# Patient Record
Sex: Male | Born: 1995 | Race: White | Hispanic: No | Marital: Single | Smoking: Current every day smoker
Health system: Southern US, Community
[De-identification: ages and names within clinical notes are randomized; demographics above are authoritative.]

## PROBLEM LIST (undated history)

## (undated) DIAGNOSIS — F32A Depression, unspecified: Secondary | ICD-10-CM

## (undated) DIAGNOSIS — F419 Anxiety disorder, unspecified: Secondary | ICD-10-CM

## (undated) DIAGNOSIS — F319 Bipolar disorder, unspecified: Secondary | ICD-10-CM

## (undated) DIAGNOSIS — F901 Attention-deficit hyperactivity disorder, predominantly hyperactive type: Secondary | ICD-10-CM

## (undated) DIAGNOSIS — F329 Major depressive disorder, single episode, unspecified: Secondary | ICD-10-CM

## (undated) HISTORY — PX: OTHER SURGICAL HISTORY: SHX169

## (undated) HISTORY — PX: FINGER SURGERY: SHX640

## (undated) HISTORY — PX: HAND SURGERY: SHX662

---
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
## 2015-04-25 ENCOUNTER — Encounter (HOSPITAL_BASED_OUTPATIENT_CLINIC_OR_DEPARTMENT_OTHER): Payer: Self-pay

## 2015-04-25 ENCOUNTER — Emergency Department (HOSPITAL_BASED_OUTPATIENT_CLINIC_OR_DEPARTMENT_OTHER)
Admission: EM | Admit: 2015-04-25 | Discharge: 2015-04-25 | Disposition: A | Discharge status: Home / Self Care | Payer: Medicaid Other | Attending: Emergency Medicine | Admitting: Emergency Medicine

## 2015-04-25 ENCOUNTER — Emergency Department (HOSPITAL_BASED_OUTPATIENT_CLINIC_OR_DEPARTMENT_OTHER): Payer: Medicaid Other

## 2015-04-25 DIAGNOSIS — R1033 Periumbilical pain: Secondary | ICD-10-CM | POA: Insufficient documentation

## 2015-04-25 DIAGNOSIS — R1031 Right lower quadrant pain: Secondary | ICD-10-CM | POA: Insufficient documentation

## 2015-04-25 DIAGNOSIS — Z79899 Other long term (current) drug therapy: Secondary | ICD-10-CM | POA: Insufficient documentation

## 2015-04-25 DIAGNOSIS — F909 Attention-deficit hyperactivity disorder, unspecified type: Secondary | ICD-10-CM | POA: Diagnosis not present

## 2015-04-25 DIAGNOSIS — F329 Major depressive disorder, single episode, unspecified: Secondary | ICD-10-CM | POA: Diagnosis not present

## 2015-04-25 DIAGNOSIS — Z720 Tobacco use: Secondary | ICD-10-CM | POA: Insufficient documentation

## 2015-04-25 DIAGNOSIS — R1030 Lower abdominal pain, unspecified: Secondary | ICD-10-CM | POA: Diagnosis not present

## 2015-04-25 DIAGNOSIS — R112 Nausea with vomiting, unspecified: Secondary | ICD-10-CM | POA: Diagnosis present

## 2015-04-25 LAB — URINALYSIS, ROUTINE W REFLEX MICROSCOPIC
Bilirubin Urine: NEGATIVE
GLUCOSE, UA: NEGATIVE mg/dL
HGB URINE DIPSTICK: NEGATIVE
Ketones, ur: 15 mg/dL — AB
LEUKOCYTES UA: NEGATIVE
Nitrite: NEGATIVE
PH: 6 (ref 5.0–8.0)
PROTEIN: NEGATIVE mg/dL
SPECIFIC GRAVITY, URINE: 1.029 (ref 1.005–1.030)
Urobilinogen, UA: 1 mg/dL (ref 0.0–1.0)

## 2015-04-25 LAB — CBC WITH DIFFERENTIAL/PLATELET
BASOS ABS: 0.1 10*3/uL (ref 0.0–0.1)
Basophils Relative: 1 %
EOS PCT: 2 %
Eosinophils Absolute: 0.1 10*3/uL (ref 0.0–0.7)
HCT: 47.8 % (ref 39.0–52.0)
Hemoglobin: 16.9 g/dL (ref 13.0–17.0)
LYMPHS PCT: 27 %
Lymphs Abs: 1.3 10*3/uL (ref 0.7–4.0)
MCH: 29.9 pg (ref 26.0–34.0)
MCHC: 35.4 g/dL (ref 30.0–36.0)
MCV: 84.5 fL (ref 78.0–100.0)
MONO ABS: 0.5 10*3/uL (ref 0.1–1.0)
Monocytes Relative: 10 %
Neutro Abs: 2.8 10*3/uL (ref 1.7–7.7)
Neutrophils Relative %: 60 %
PLATELETS: 194 10*3/uL (ref 150–400)
RBC: 5.66 MIL/uL (ref 4.22–5.81)
RDW: 12.2 % (ref 11.5–15.5)
WBC: 4.7 10*3/uL (ref 4.0–10.5)

## 2015-04-25 LAB — COMPREHENSIVE METABOLIC PANEL
ALT: 35 U/L (ref 17–63)
ANION GAP: 4 — AB (ref 5–15)
AST: 29 U/L (ref 15–41)
Albumin: 4.7 g/dL (ref 3.5–5.0)
Alkaline Phosphatase: 52 U/L (ref 38–126)
BUN: 16 mg/dL (ref 6–20)
CHLORIDE: 108 mmol/L (ref 101–111)
CO2: 26 mmol/L (ref 22–32)
Calcium: 9.5 mg/dL (ref 8.9–10.3)
Creatinine, Ser: 1.05 mg/dL (ref 0.61–1.24)
Glucose, Bld: 95 mg/dL (ref 65–99)
POTASSIUM: 3.8 mmol/L (ref 3.5–5.1)
Sodium: 138 mmol/L (ref 135–145)
Total Bilirubin: 0.8 mg/dL (ref 0.3–1.2)
Total Protein: 7.4 g/dL (ref 6.5–8.1)

## 2015-04-25 LAB — LIPASE, BLOOD: LIPASE: 41 U/L (ref 22–51)

## 2015-04-25 MED ORDER — IOHEXOL 300 MG/ML  SOLN
100.0000 mL | Freq: Once | INTRAMUSCULAR | Status: AC | PRN
  Administered 2015-04-25: 100 mL via INTRAVENOUS

## 2015-04-25 MED ORDER — SODIUM CHLORIDE 0.9 % IV BOLUS (SEPSIS)
1000.0000 mL | Freq: Once | INTRAVENOUS | Status: AC
  Administered 2015-04-25: 1000 mL via INTRAVENOUS

## 2015-04-25 MED ORDER — ONDANSETRON HCL 4 MG/2ML IJ SOLN
4.0000 mg | Freq: Once | INTRAMUSCULAR | Status: AC
  Administered 2015-04-25: 4 mg via INTRAVENOUS
  Filled 2015-04-25: qty 2

## 2015-04-25 MED ORDER — ONDANSETRON HCL 4 MG PO TABS: 4.0000 mg | ORAL_TABLET | Freq: Four times a day (QID) | ORAL | Status: DC

## 2015-04-25 MED ORDER — IOHEXOL 300 MG/ML  SOLN
25.0000 mL | Freq: Once | INTRAMUSCULAR | Status: AC | PRN
  Administered 2015-04-25: 25 mL via ORAL

## 2015-04-25 NOTE — ED Notes (Signed)
Pt reports was at work when suddenly with abd pain and n/v.  Denies diarrhea.  States his dad last night had same symptoms.

## 2015-04-25 NOTE — Discharge Instructions (Signed)

## 2015-04-25 NOTE — ED Notes (Signed)
MD at bedside. 

## 2015-04-25 NOTE — ED Provider Notes (Signed)
CSN: [id number]     Arrival date & time 04/25/15  1107 History   First MD Initiated Contact with Patient 04/25/15 1148     Chief Complaint  Patient presents with  . Abdominal Pain     (Consider location/radiation/quality/duration/timing/severity/associated sxs/prior Treatment) HPI Comments: Patient with sudden onset of nausea and vomiting while at work around 10 AM. States is vomited about 5 times. Nonbloody and nonbilious. His father last night was also vomiting for unknown reason. Patient denies any diarrhea or fever. He denies any urinary symptoms. Denies any recent travel or antibiotic use. He also has lower abdominal pain in the right side which is been an ongoing issue he says for the past 3 years and is unchanged today.  Patient is a 19 y.o. male presenting with abdominal pain. The history is provided by the patient.  Abdominal Pain Associated symptoms: nausea and vomiting   Associated symptoms: no chest pain, no cough, no diarrhea, no dysuria, no fever and no shortness of breath     Past Medical History  Diagnosis Date  . ADHD, impulsive type     intermittent explosive disorder  . Depression    Past Surgical History  Procedure Laterality Date  . Hand surgery    . Dental    . Finger surgery     No family history on file. Social History  Substance Use Topics  . Smoking status: Current Every Day Smoker -- 1.00 packs/day    Types: Cigarettes  . Smokeless tobacco: None  . Alcohol Use: No    Review of Systems  Constitutional: Positive for activity change and appetite change. Negative for fever.  HENT: Negative for congestion.   Respiratory: Negative for cough, chest tightness and shortness of breath.   Cardiovascular: Negative for chest pain.  Gastrointestinal: Positive for nausea, vomiting and abdominal pain. Negative for diarrhea.  Genitourinary: Negative for dysuria and urgency.  Musculoskeletal: Negative for back pain and arthralgias.  Skin: Negative for rash.   Neurological: Negative for dizziness, weakness and headaches.  A complete 10 system review of systems was obtained and all systems are negative except as noted in the HPI and PMH.      Allergies  Amphetamine-dextroamphetamine  Home Medications   Prior to Admission medications   Medication Sig Start Date End Date Taking? Authorizing Provider  ARIPiprazole (ABILIFY) 10 MG tablet Take 10 mg by mouth at bedtime.    Historical Provider, MD  benztropine (COGENTIN) 1 MG tablet Take 1 mg by mouth 2 (two) times daily.    Historical Provider, MD  guanFACINE (TENEX) 1 MG tablet Take 1 mg by mouth 2 (two) times daily.    Historical Provider, MD  lisdexamfetamine (VYVANSE) 40 MG capsule Take 40 mg by mouth every morning.    Historical Provider, MD  ondansetron (ZOFRAN) 4 MG tablet Take 1 tablet (4 mg total) by mouth every 6 (six) hours. 04/25/15   Glynn OctaveStephen Conlin Brahm, MD   BP 125/74 mmHg  Pulse 65  Temp(Src) 98 F (36.7 C) (Oral)  Resp 16  Ht 6\' 3"  (1.905 m)  Wt 245 lb (111.131 kg)  BMI 30.62 kg/m2  SpO2 98% Physical Exam  Constitutional: He is oriented to person, place, and time. He appears well-developed and well-nourished. No distress.  HENT:  Head: Normocephalic and atraumatic.  Mouth/Throat: Oropharynx is clear and moist. No oropharyngeal exudate.  Eyes: Conjunctivae and EOM are normal. Pupils are equal, round, and reactive to light.  Neck: Normal range of motion. Neck supple.  No meningismus.  Cardiovascular: Normal rate, regular rhythm, normal heart sounds and intact distal pulses.   No murmur heard. Pulmonary/Chest: Effort normal and breath sounds normal. No respiratory distress.  Abdominal: Soft. There is tenderness. There is no rebound and no guarding.  TTP periumbilical and RLQ. No guarding  Musculoskeletal: Normal range of motion. He exhibits no edema or tenderness.  Neurological: He is alert and oriented to person, place, and time. No cranial nerve deficit. He exhibits normal  muscle tone. Coordination normal.  No ataxia on finger to nose bilaterally. No pronator drift. 5/5 strength throughout. CN 2-12 intact. Negative Romberg. Equal grip strength. Sensation intact. Gait is normal.   Skin: Skin is warm.  Psychiatric: He has a normal mood and affect. His behavior is normal.  Nursing note and vitals reviewed.   ED Course  Procedures (including critical care time) Labs Review Labs Reviewed  URINALYSIS, ROUTINE W REFLEX MICROSCOPIC (NOT AT [facility]) - Abnormal; Notable for the following:    Color, Urine AMBER (*)    Ketones, ur 15 (*)    All other components within normal limits  COMPREHENSIVE METABOLIC PANEL - Abnormal; Notable for the following:    Anion gap 4 (*)    All other components within normal limits  CBC WITH DIFFERENTIAL/PLATELET  LIPASE, BLOOD    Imaging Review Ct Abdomen Pelvis W Contrast  04/25/2015  CLINICAL DATA:  Right lower quadrant pain, nausea and vomiting starting today EXAM: CT ABDOMEN AND PELVIS WITH CONTRAST TECHNIQUE: Multidetector CT imaging of the abdomen and pelvis was performed using the standard protocol following bolus administration of intravenous contrast. CONTRAST:  25mL OMNIPAQUE IOHEXOL 300 MG/ML SOLN, OMNIPAQUE IOHEXOL 300 MG/ML SOLN COMPARISON:  None. FINDINGS: Lung bases are unremarkable. Liver, pancreas, spleen and adrenal glands are unremarkable. No calcified gallstones are noted within gallbladder. No aortic aneurysm. Kidneys are symmetrical in size and enhancement. No hydronephrosis or hydroureter. No small bowel obstruction. No ascites or free air. No adenopathy. No pericecal inflammation. Normal appendix noted in axial image 75. Some colonic stool and gas noted in transverse colon and descending colon. Moderate stool noted in sigmoid colon. Some colonic gas noted mid sigmoid colon. No distal colonic obstruction. Prostate gland and seminal vesicles are unremarkable. The urinary bladder is unremarkable. No destructive bony  lesions are noted within pelvis. There is no inguinal adenopathy. IMPRESSION: 1. No acute inflammatory process within abdomen or pelvis. 2. No hydronephrosis or hydroureter. 3. Normal appendix.  No pericecal inflammation. 4. No small bowel obstruction.  What Electronically Signed   By: Natasha Mead M.D.   On: 04/25/2015 13:14   I have personally reviewed and evaluated these images and lab results as part of my medical decision-making.   EKG Interpretation None      MDM   Final diagnoses:  Nausea and vomiting, vomiting of unspecified type   Acute onset of nausea and vomiting 2 hours ago with similar contacts at home. Ongoing right lower quadrant abdominal pain for the past 3 years.  UA negative. Labs normal.   CT scan shows normal appendix. No other acute pathology. Patient tolerated by mouth. No further vomiting.  May be related to viral illness that he has had at home with sick contacts. Zofran given for symptomatic relief. Follow-up with PCP. Return precautions discussed.     Glynn Octave, MD 04/25/15 [phone]

## 2015-04-25 NOTE — ED Notes (Signed)
Pt given apple juice  

## 2017-02-06 IMAGING — CT CT ABD-PELV W/ CM
2 of 4 series · 16 of 46 positions shown, 18 images · IV contrast (APPLIED)
Comparison: None.

CLINICAL DATA: Right lower quadrant pain, nausea and vomiting
starting today

EXAM:
CT ABDOMEN AND PELVIS WITH CONTRAST
TECHNIQUE: Multidetector CT imaging of the abdomen and pelvis was performed
using the standard protocol following bolus administration of
intravenous contrast.
CONTRAST:  25mL OMNIPAQUE IOHEXOL 300 MG/ML SOLN, 100mL OMNIPAQUE
IOHEXOL 300 MG/ML SOLN

[Series 2: abd/pelvis 5.0 b31f · axial · 0.81mm/px · z∈[+831,+1326]mm · 13 of 109 slices shown, 15 images]
[im 5/109  soft-tissue]
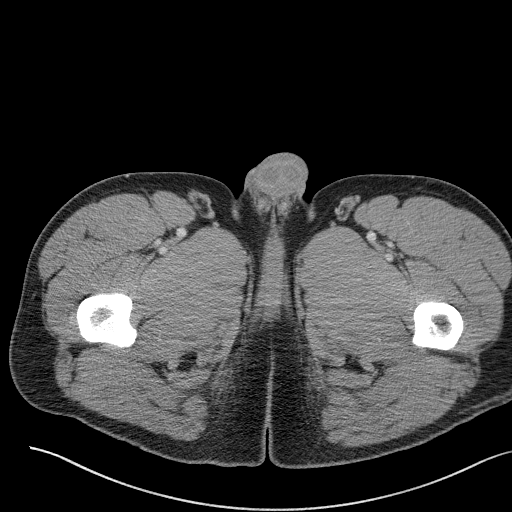
[im 5/109  bone]
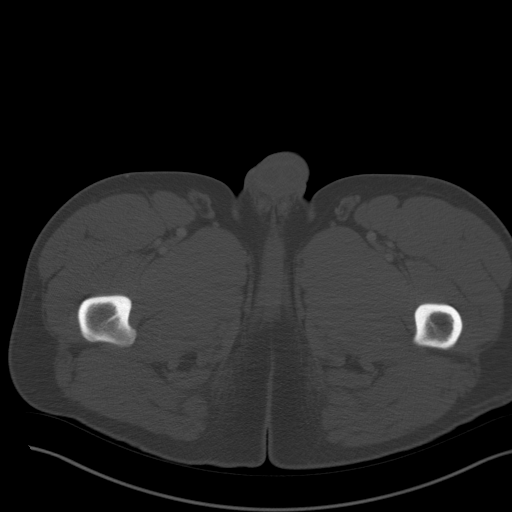
[im 13/109  soft-tissue]
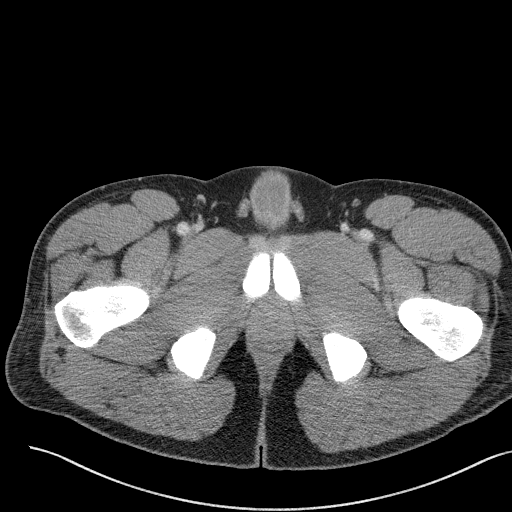
[im 22/109  soft-tissue]
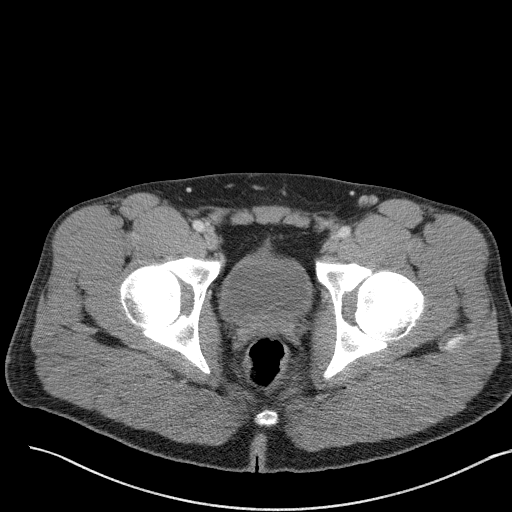
[im 31/109  soft-tissue]
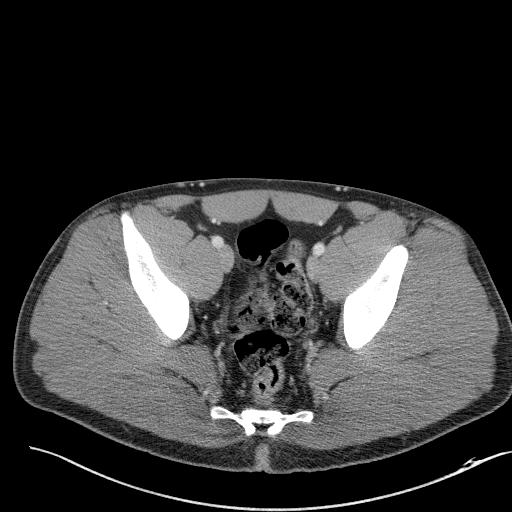
[im 39/109  soft-tissue]
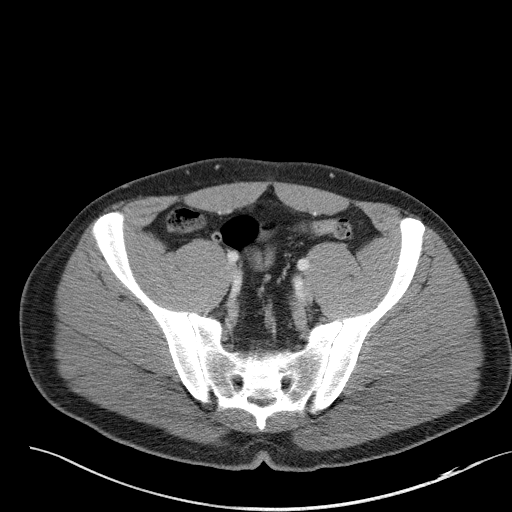
[im 48/109  soft-tissue]
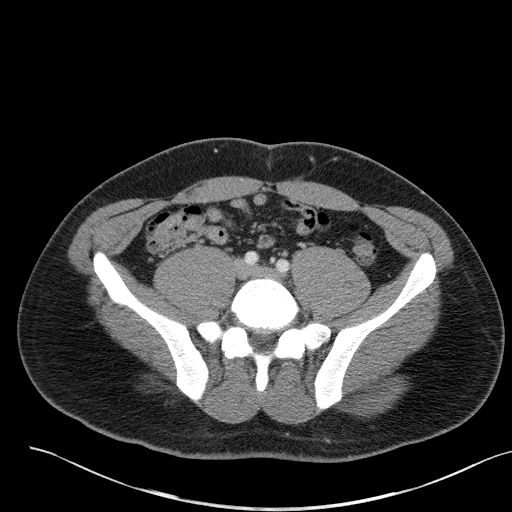
[im 57/109  soft-tissue]
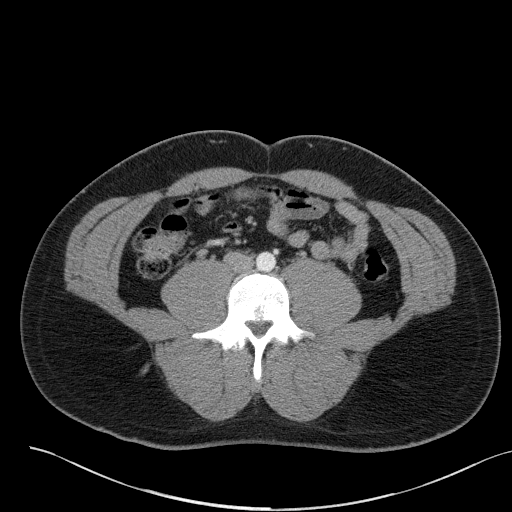
[im 61/109  soft-tissue]
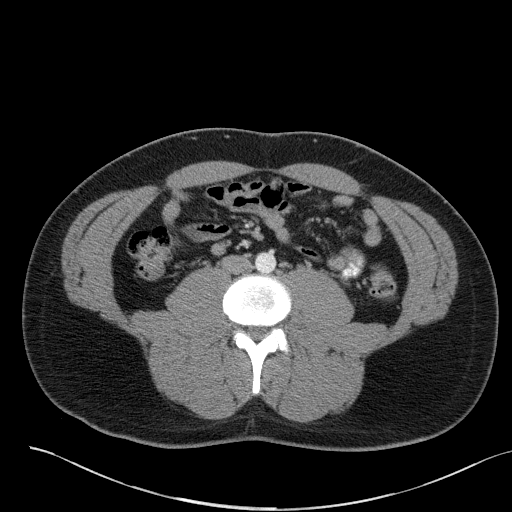
[im 70/109  soft-tissue]
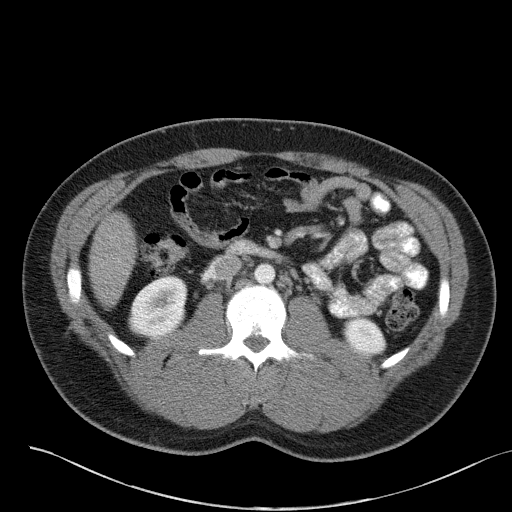
[im 70/109  bone]
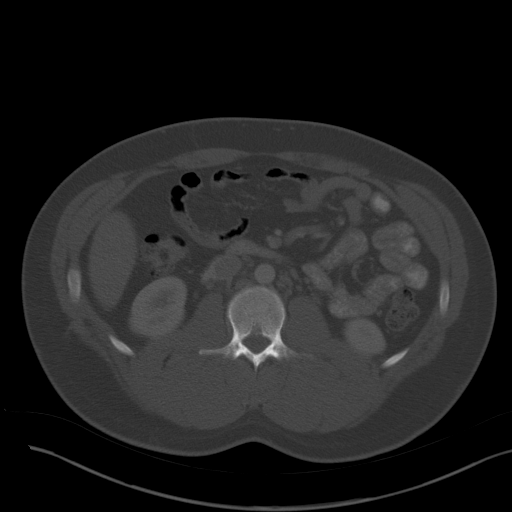
[im 78/109  soft-tissue]
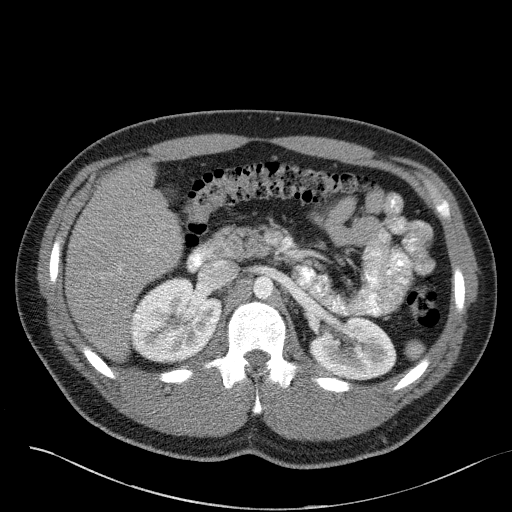
[im 87/109  soft-tissue]
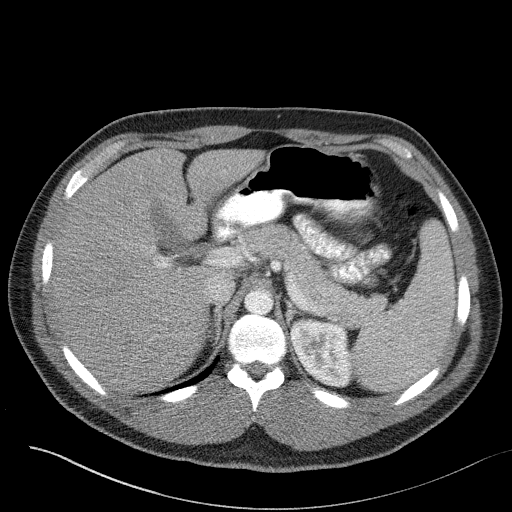
[im 96/109  soft-tissue]
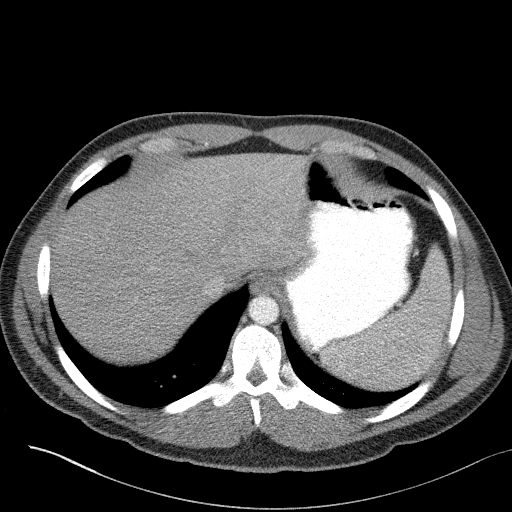
[im 104/109  soft-tissue]
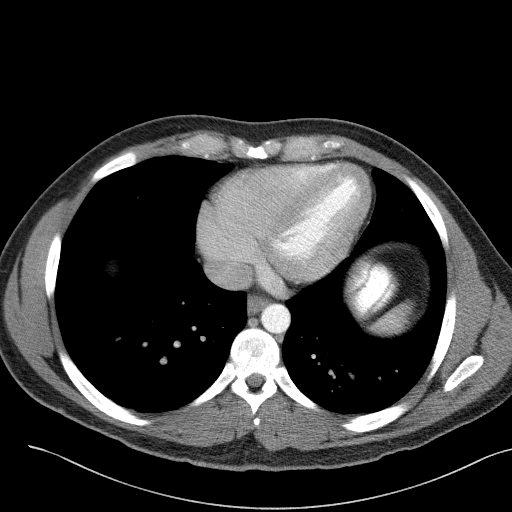

[Series 5: abd/pelvis 3.0 coronal · coronal · 0.78mm/px · 3 of 98 slices shown]
[im 33/98  soft-tissue]
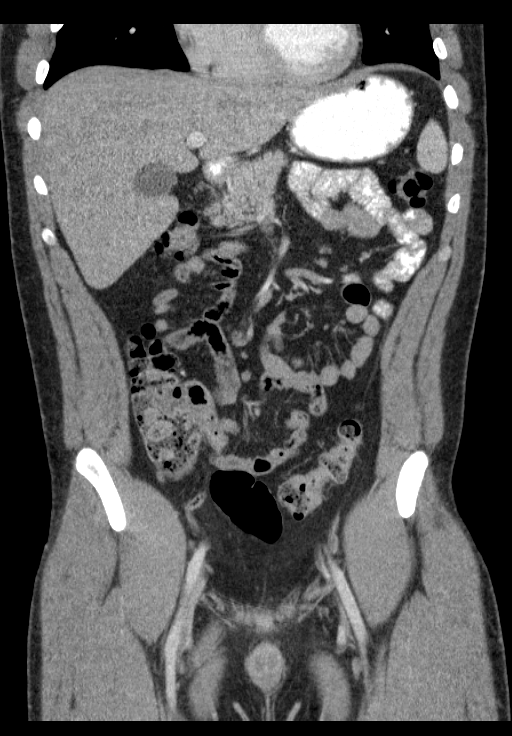
[im 44/98  soft-tissue]
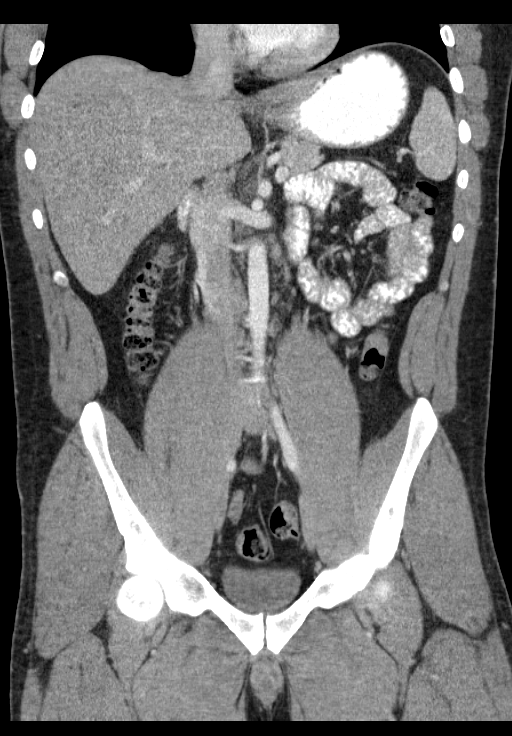
[im 54/98  soft-tissue]
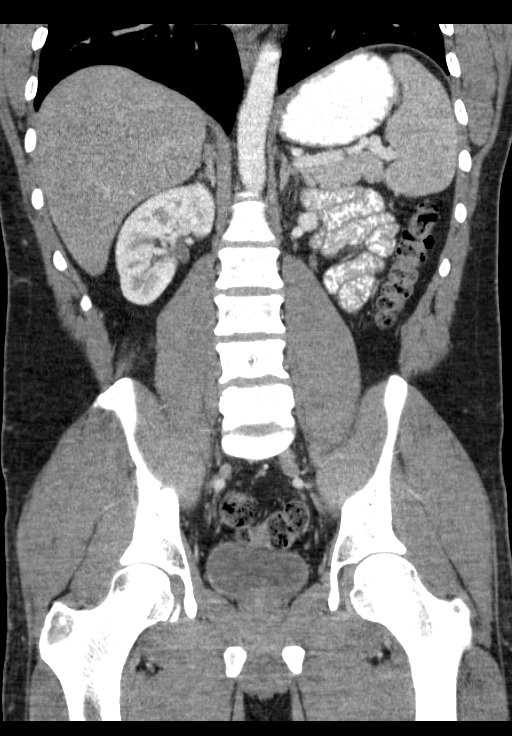

[16 of 46 positions shown; findings below may reference images not displayed]

FINDINGS: Lung bases are unremarkable. Liver, pancreas, spleen and adrenal
glands are unremarkable. No calcified gallstones are noted within
gallbladder. No aortic aneurysm. Kidneys are symmetrical in size and
enhancement. No hydronephrosis or hydroureter.

No small bowel obstruction. No ascites or free air. No adenopathy.
No pericecal inflammation. Normal appendix noted in axial image 75.

Some colonic stool and gas noted in transverse colon and descending
colon. Moderate stool noted in sigmoid colon. Some colonic gas noted
mid sigmoid colon. No distal colonic obstruction. Prostate gland and
seminal vesicles are unremarkable. The urinary bladder is
unremarkable. No destructive bony lesions are noted within pelvis.
There is no inguinal adenopathy.
IMPRESSION: 1. No acute inflammatory process within abdomen or pelvis.
2. No hydronephrosis or hydroureter.
3. Normal appendix.  No pericecal inflammation.
4. No small bowel obstruction.  What

## 2018-01-03 ENCOUNTER — Emergency Department
Admission: EM | Admit: 2018-01-03 | Discharge: 2018-01-03 | Disposition: A | Discharge status: Home / Self Care | Payer: Self-pay | Attending: Emergency Medicine | Admitting: Emergency Medicine

## 2018-01-03 ENCOUNTER — Other Ambulatory Visit: Payer: Self-pay

## 2018-01-03 ENCOUNTER — Emergency Department: Payer: Self-pay

## 2018-01-03 DIAGNOSIS — F17210 Nicotine dependence, cigarettes, uncomplicated: Secondary | ICD-10-CM | POA: Insufficient documentation

## 2018-01-03 DIAGNOSIS — R079 Chest pain, unspecified: Secondary | ICD-10-CM | POA: Insufficient documentation

## 2018-01-03 LAB — BASIC METABOLIC PANEL
Anion gap: 7 (ref 5–15)
BUN: 16 mg/dL (ref 6–20)
CHLORIDE: 111 mmol/L (ref 98–111)
CO2: 23 mmol/L (ref 22–32)
CREATININE: 0.9 mg/dL (ref 0.61–1.24)
Calcium: 9.2 mg/dL (ref 8.9–10.3)
GFR calc non Af Amer: >60 mL/min (ref >60)
GLUCOSE: 109 mg/dL — AB (ref 70–99)
Potassium: 3.9 mmol/L (ref 3.5–5.1)
Sodium: 141 mmol/L (ref 135–145)

## 2018-01-03 LAB — CBC
HCT: 47.3 % (ref 40.0–52.0)
Hemoglobin: 16.7 g/dL (ref 13.0–18.0)
MCH: 30.4 pg (ref 26.0–34.0)
MCHC: 35.4 g/dL (ref 32.0–36.0)
MCV: 85.9 fL (ref 80.0–100.0)
PLATELETS: 221 10*3/uL (ref 150–440)
RBC: 5.51 MIL/uL (ref 4.40–5.90)
RDW: 12.4 % (ref 11.5–14.5)
WBC: 6.9 10*3/uL (ref 3.8–10.6)

## 2018-01-03 LAB — TROPONIN I
Troponin I: <0.03 ng/mL (ref <0.03)
Troponin I: <0.03 ng/mL (ref <0.03)

## 2018-01-03 MED ORDER — ASPIRIN 81 MG PO CHEW
324.0000 mg | CHEWABLE_TABLET | Freq: Once | ORAL | Status: AC
  Administered 2018-01-03: 324 mg via ORAL
  Filled 2018-01-03: qty 4

## 2018-01-03 NOTE — ED Notes (Signed)
NAD noted at time of D/C. Pt denies questions or concerns. Pt ambulatory to the lobby at this time.  

## 2018-01-03 NOTE — ED Triage Notes (Signed)
Patient to ED via EMS for chest pain.

## 2018-01-03 NOTE — ED Notes (Signed)
Pt presents to ED with c/o substernal chest pain that started "at some point last night" while at a church. Pt reports "ex-fiance problems at home". Pt is alert and oriented. Pt reports being awake for 36 hours. Pt states pain has resolved at this time, however while the chest pain was occurring pt reports he was dizzy and nauseas. Pt visualized in NAD at this time.

## 2018-01-03 NOTE — Discharge Instructions (Addendum)

## 2018-01-03 NOTE — ED Provider Notes (Signed)
[facility] Emergency Department Provider Note   ____________________________________________   First MD Initiated Contact with Patient 01/03/18 [phone]     (approximate)  I have reviewed the triage vital signs and the nursing notes.   HISTORY  Chief Complaint Chest Pain    HPI Ella Guillotte is a 22 y.o. male reports he had chest pain earlier today  Patient reports that he has not been sleeping too well, he was recently in a break-up with his fiance.  He reports that for the last day she felt anxious and stressed out about it.  Last night he began experiencing a feeling of a sharp discomfort underneath his breastbone, came and went and lasted maybe an hour.  Reports he was having pain on the paramedics arrived to his house, but now after he is rested he is not having any further pain or trouble.  Denies any ongoing symptoms  No personal history of heart disease.  No history of blood clots.  No recent or major surgeries.  No leg swelling.  He does smoke occasionally.  Does not take any medications at all.  As of breath.  Nothing seemed to make the pain better or worse.  Is not a heaviness or pressure feeling and it did not move or radiate.  Is not associated any nausea vomiting or sweats.  Again, reports all pain and symptoms are resolved now.  Reports he was trying to rest at a nearby church when this started.   Past Medical History:  Diagnosis Date  . ADHD, impulsive type    intermittent explosive disorder  . Depression     There are no active problems to display for this patient.   Past Surgical History:  Procedure Laterality Date  . dental    . FINGER SURGERY    . HAND SURGERY     Patient reports family history of heart disease and diabetes  Current medications Reports none  Allergies Amphetamine-dextroamphetamine  No family history on file.  Social History Social History   Tobacco Use  . Smoking status: Current Every Day Smoker    Packs/day: 1.00    Types: Cigarettes  Substance Use Topics  . Alcohol use: No  . Drug use: No    Review of Systems Constitutional: No fever/chills Eyes: No visual changes. ENT: No sore throat. Cardiovascular: See HPI  Respiratory: Denies shortness of breath. Gastrointestinal: No abdominal pain.  No nausea, no vomiting.   Genitourinary: Negative for dysuria. Musculoskeletal: Negative for back pain. Skin: Negative for rash. Neurological: Negative for headaches, focal weakness or numbness.    ____________________________________________   PHYSICAL EXAM:  VITAL SIGNS: ED Triage Vitals  Enc Vitals Group     BP 01/03/18 0515 118/83     Pulse Rate 01/03/18 0515 76     Resp 01/03/18 0515 20     Temp 01/03/18 0515 98.7 F (37.1 C)     Temp Source 01/03/18 0515 Oral     SpO2 01/03/18 0515 97 %     Weight 01/03/18 0516 240 lb (108.9 kg)     Height 01/03/18 0516 6\' 3"  (1.905 m)     Head Circumference --      Peak Flow --      Pain Score --      Pain Loc --      Pain Edu? --      Excl. in GC? --     Constitutional: Alert and oriented. Well appearing and in no acute distress.  Resting  on entry, awakens and sits up is very pleasant. Eyes: Conjunctivae are normal. Head: Atraumatic. Nose: No congestion/rhinnorhea. Mouth/Throat: Mucous membranes are moist. Neck: No stridor.   Cardiovascular: Normal rate, regular rhythm. Grossly normal heart sounds.  Good peripheral circulation.  No chest pain induced by laying flat or sitting up. Respiratory: Normal respiratory effort.  No retractions. Lungs CTAB. Gastrointestinal: Soft and nontender. No distention. Musculoskeletal: No lower extremity tenderness nor edema. Neurologic:  Normal speech and language. No gross focal neurologic deficits are appreciated.  No lower extremity edema.  No venous cords or congestion. Skin:  Skin is warm, dry and intact. No rash noted. Psychiatric: Mood and affect are normal. Speech and behavior are  normal.  ____________________________________________   LABS (all labs ordered are listed, but only abnormal results are displayed)  Labs Reviewed  BASIC METABOLIC PANEL - Abnormal; Notable for the following components:      Result Value   Glucose, Bld 109 (*)    All other components within normal limits  CBC  TROPONIN I  TROPONIN I   ____________________________________________  EKG  Reviewed and entered by me at 5:20 AM Heart rate 75 QRS 110 QTc 440 Normal sinus rhythm, no evidence of ischemia or ectopy ____________________________________________  RADIOLOGY    Chest x-ray reviewed normal ____________________________________________   PROCEDURES  Procedure(s) performed: None  Procedures  Critical Care performed: No  ____________________________________________   INITIAL IMPRESSION / ASSESSMENT AND PLAN / ED COURSE  Pertinent labs & imaging results that were available during my care of the patient were reviewed by me and considered in my medical decision making (see chart for details).  Differential diagnosis includes, but is not limited to, ACS, aortic dissection, pulmonary embolism, cardiac tamponade, pneumothorax, pneumonia, pericarditis, myocarditis, GI-related causes including esophagitis/gastritis, and musculoskeletal chest wall pain.         Pulmonary Embolism Rule-out Criteria (PERC rule)                        If YES to ANY of the following, the PERC rule is not satisfied and cannot be used to rule out PE in this patient (consider d-dimer or imaging depending on pre-test probability).                      If NO to ALL of the following, AND the clinician's pre-test probability is <15%, the [facility] rule is satisfied and there is no need for further workup (including no need to obtain a d-dimer) as the post-test probability of pulmonary embolism is <2%.                      Mnemonic is HAD CLOTS   H - hormone use (exogenous estrogen)      No. A -  age > 50                                                 No. D - DVT/PE history                                      No.   C - coughing blood (hemoptysis)  No. L - leg swelling, unilateral                             No. O - O2 Sat on Room Air < 95%                  No. T - tachycardia (HR ? 100)                         No. S - surgery or trauma, recent                      No.   Based on my evaluation of the patient, including application of this decision instrument, further testing to evaluate for pulmonary embolism is not indicated at this time.   ----------------------------------------- 7:36 AM on 01/03/2018 -----------------------------------------  There is low risk for coronary disease.  Atypical chest pain resolved on its own without treatment.  Discussed with patient, will plan to have the [facility] as he reports he has no primary physician.  In addition, we discussed careful return precautions.  Check a second troponin if negative anticipate discharge home.  Trop 2 normal  Return precautions and treatment recommendations and follow-up discussed with the patient who is agreeable with the plan.        ____________________________________________   FINAL CLINICAL IMPRESSION(S) / ED DIAGNOSES  Final diagnoses:  Chest pain with low risk for cardiac etiology      NEW MEDICATIONS STARTED DURING THIS VISIT:  New Prescriptions   No medications on file     Note:  This document was prepared using Dragon voice recognition software and may include unintentional dictation errors.     Sharyn Creamer, MD 01/03/18 [phone]

## 2018-01-03 NOTE — ED Notes (Signed)
Patient to ED via EMS for chest pain.  EMS reports patient recently kicked out of girlfriends home and recently lost job.  Reports no sleep in approximately 36 hours.

## 2019-07-12 ENCOUNTER — Emergency Department (HOSPITAL_COMMUNITY): Payer: Medicaid Other | Admitting: Anesthesiology

## 2019-07-12 ENCOUNTER — Inpatient Hospital Stay (HOSPITAL_COMMUNITY)
Admission: EM | Admit: 2019-07-12 | Discharge: 2019-07-13 | DRG: 420 | Disposition: A | Discharge status: Home / Self Care | Payer: Medicaid Other | Attending: Surgery | Admitting: Surgery

## 2019-07-12 ENCOUNTER — Encounter (HOSPITAL_COMMUNITY): Payer: Self-pay

## 2019-07-12 ENCOUNTER — Encounter (HOSPITAL_COMMUNITY): Admission: EM | Disposition: A | Discharge status: Home / Self Care | Payer: Self-pay | Source: Home / Self Care

## 2019-07-12 ENCOUNTER — Other Ambulatory Visit: Payer: Self-pay

## 2019-07-12 DIAGNOSIS — S31610A Laceration without foreign body of abdominal wall, right upper quadrant with penetration into peritoneal cavity, initial encounter: Secondary | ICD-10-CM | POA: Diagnosis present

## 2019-07-12 DIAGNOSIS — Z20822 Contact with and (suspected) exposure to covid-19: Secondary | ICD-10-CM | POA: Diagnosis present

## 2019-07-12 DIAGNOSIS — R030 Elevated blood-pressure reading, without diagnosis of hypertension: Secondary | ICD-10-CM | POA: Diagnosis present

## 2019-07-12 DIAGNOSIS — S31119A Laceration without foreign body of abdominal wall, unspecified quadrant without penetration into peritoneal cavity, initial encounter: Secondary | ICD-10-CM | POA: Diagnosis present

## 2019-07-12 DIAGNOSIS — F17200 Nicotine dependence, unspecified, uncomplicated: Secondary | ICD-10-CM | POA: Diagnosis present

## 2019-07-12 DIAGNOSIS — S36114A Minor laceration of liver, initial encounter: Principal | ICD-10-CM | POA: Diagnosis present

## 2019-07-12 HISTORY — PX: LAPAROTOMY: SHX154

## 2019-07-12 HISTORY — DX: Bipolar disorder, unspecified: F31.9

## 2019-07-12 HISTORY — PX: LAPAROSCOPY ABDOMEN DIAGNOSTIC: PRO50

## 2019-07-12 HISTORY — PX: LAPAROSCOPY: SHX197

## 2019-07-12 HISTORY — DX: Anxiety disorder, unspecified: F41.9

## 2019-07-12 LAB — CBC
HCT: 49.9 % (ref 39.0–52.0)
Hemoglobin: 17.4 g/dL — ABNORMAL HIGH (ref 13.0–17.0)
MCH: 30 pg (ref 26.0–34.0)
MCHC: 34.9 g/dL (ref 30.0–36.0)
MCV: 86 fL (ref 80.0–100.0)
Platelets: 227 10*3/uL (ref 150–400)
RBC: 5.8 MIL/uL (ref 4.22–5.81)
RDW: 12.2 % (ref 11.5–15.5)
WBC: 7.1 10*3/uL (ref 4.0–10.5)
nRBC: 0 % (ref 0.0–0.2)

## 2019-07-12 LAB — SAMPLE TO BLOOD BANK

## 2019-07-12 LAB — I-STAT CHEM 8, ED
BUN: 16 mg/dL (ref 6–20)
Calcium, Ion: 1.23 mmol/L (ref 1.15–1.40)
Chloride: 105 mmol/L (ref 98–111)
Creatinine, Ser: 1.1 mg/dL (ref 0.61–1.24)
Glucose, Bld: 108 mg/dL — ABNORMAL HIGH (ref 70–99)
HCT: 49 % (ref 39.0–52.0)
Hemoglobin: 16.7 g/dL (ref 13.0–17.0)
Potassium: 3.6 mmol/L (ref 3.5–5.1)
Sodium: 141 mmol/L (ref 135–145)
TCO2: 24 mmol/L (ref 22–32)

## 2019-07-12 LAB — COMPREHENSIVE METABOLIC PANEL
ALT: 58 U/L — ABNORMAL HIGH (ref 0–44)
AST: 40 U/L (ref 15–41)
Albumin: 4.4 g/dL (ref 3.5–5.0)
Alkaline Phosphatase: 44 U/L (ref 38–126)
Anion gap: 13 (ref 5–15)
BUN: 14 mg/dL (ref 6–20)
CO2: 22 mmol/L (ref 22–32)
Calcium: 10 mg/dL (ref 8.9–10.3)
Chloride: 105 mmol/L (ref 98–111)
Creatinine, Ser: 1.17 mg/dL (ref 0.61–1.24)
GFR calc Af Amer: >60 mL/min (ref >60)
GFR calc non Af Amer: >60 mL/min (ref >60)
Glucose, Bld: 112 mg/dL — ABNORMAL HIGH (ref 70–99)
Potassium: 3.7 mmol/L (ref 3.5–5.1)
Sodium: 140 mmol/L (ref 135–145)
Total Bilirubin: <0.1 mg/dL — ABNORMAL LOW (ref 0.3–1.2)
Total Protein: 7.2 g/dL (ref 6.5–8.1)

## 2019-07-12 LAB — ETHANOL: Alcohol, Ethyl (B): <10 mg/dL (ref <10)

## 2019-07-12 LAB — RESPIRATORY PANEL BY RT PCR (FLU A&B, COVID)
Influenza A by PCR: NEGATIVE
Influenza B by PCR: NEGATIVE
SARS Coronavirus 2 by RT PCR: NEGATIVE

## 2019-07-12 LAB — CDS SEROLOGY

## 2019-07-12 LAB — PROTIME-INR
INR: 0.9 (ref 0.8–1.2)
Prothrombin Time: 12.4 seconds (ref 11.4–15.2)

## 2019-07-12 SURGERY — LAPAROSCOPY, DIAGNOSTIC
Anesthesia: General | Site: Abdomen

## 2019-07-12 MED ORDER — CEFAZOLIN SODIUM-DEXTROSE 2-3 GM-%(50ML) IV SOLR
INTRAVENOUS | Status: DC | PRN
  Administered 2019-07-12: 2 g via INTRAVENOUS

## 2019-07-12 MED ORDER — FENTANYL CITRATE (PF) 250 MCG/5ML IJ SOLN
INTRAMUSCULAR | Status: AC
  Filled 2019-07-12: qty 5

## 2019-07-12 MED ORDER — LACTATED RINGERS IV SOLN: INTRAVENOUS | Status: DC | PRN

## 2019-07-12 MED ORDER — ONDANSETRON HCL 4 MG/2ML IJ SOLN
INTRAMUSCULAR | Status: DC | PRN
  Administered 2019-07-12: 4 mg via INTRAVENOUS

## 2019-07-12 MED ORDER — ENOXAPARIN SODIUM 40 MG/0.4ML ~~LOC~~ SOLN
40.0000 mg | SUBCUTANEOUS | Status: DC
  Administered 2019-07-12: 40 mg via SUBCUTANEOUS
  Filled 2019-07-12: qty 0.4

## 2019-07-12 MED ORDER — CEFAZOLIN SODIUM-DEXTROSE 1-4 GM/50ML-% IV SOLN
1.0000 g | Freq: Once | INTRAVENOUS | Status: AC
  Administered 2019-07-12: 1 g via INTRAVENOUS
  Filled 2019-07-12: qty 50

## 2019-07-12 MED ORDER — SUGAMMADEX SODIUM 200 MG/2ML IV SOLN
INTRAVENOUS | Status: DC | PRN
  Administered 2019-07-12: 300 mg via INTRAVENOUS

## 2019-07-12 MED ORDER — ONDANSETRON HCL 4 MG/2ML IJ SOLN: 4.0000 mg | Freq: Once | INTRAMUSCULAR | Status: DC | PRN

## 2019-07-12 MED ORDER — OXYCODONE HCL 5 MG/5ML PO SOLN
5.0000 mg | ORAL | Status: DC | PRN
  Administered 2019-07-12 – 2019-07-13 (×5): 10 mg via ORAL
  Filled 2019-07-12 (×5): qty 10

## 2019-07-12 MED ORDER — 0.9 % SODIUM CHLORIDE (POUR BTL) OPTIME
TOPICAL | Status: DC | PRN
  Administered 2019-07-12 (×3): 1000 mL

## 2019-07-12 MED ORDER — SUCCINYLCHOLINE CHLORIDE 20 MG/ML IJ SOLN
INTRAMUSCULAR | Status: DC | PRN
  Administered 2019-07-12: 140 mg via INTRAVENOUS

## 2019-07-12 MED ORDER — SUCCINYLCHOLINE CHLORIDE 200 MG/10ML IV SOSY
PREFILLED_SYRINGE | INTRAVENOUS | Status: AC
  Filled 2019-07-12: qty 10

## 2019-07-12 MED ORDER — ENOXAPARIN SODIUM 80 MG/0.8ML ~~LOC~~ SOLN
80.0000 mg | SUBCUTANEOUS | Status: DC
  Administered 2019-07-13: 80 mg via SUBCUTANEOUS
  Filled 2019-07-12: qty 0.8

## 2019-07-12 MED ORDER — ONDANSETRON 4 MG PO TBDP: 4.0000 mg | ORAL_TABLET | Freq: Four times a day (QID) | ORAL | Status: DC | PRN

## 2019-07-12 MED ORDER — ARTIFICIAL TEARS OPHTHALMIC OINT
TOPICAL_OINTMENT | OPHTHALMIC | Status: AC
  Filled 2019-07-12: qty 3.5

## 2019-07-12 MED ORDER — LIDOCAINE HCL (CARDIAC) PF 100 MG/5ML IV SOSY
PREFILLED_SYRINGE | INTRAVENOUS | Status: DC | PRN
  Administered 2019-07-12: 60 mg via INTRATRACHEAL

## 2019-07-12 MED ORDER — FENTANYL CITRATE (PF) 100 MCG/2ML IJ SOLN
25.0000 ug | INTRAMUSCULAR | Status: DC | PRN
  Administered 2019-07-12 (×3): 50 ug via INTRAVENOUS

## 2019-07-12 MED ORDER — IBUPROFEN 600 MG PO TABS
600.0000 mg | ORAL_TABLET | Freq: Three times a day (TID) | ORAL | Status: DC
  Administered 2019-07-12 – 2019-07-13 (×5): 600 mg via ORAL
  Filled 2019-07-12 (×5): qty 1

## 2019-07-12 MED ORDER — PROPOFOL 10 MG/ML IV BOLUS
INTRAVENOUS | Status: AC
  Filled 2019-07-12: qty 40

## 2019-07-12 MED ORDER — LACTATED RINGERS IV SOLN: INTRAVENOUS | Status: DC

## 2019-07-12 MED ORDER — ONDANSETRON HCL 4 MG/2ML IJ SOLN
INTRAMUSCULAR | Status: AC
  Filled 2019-07-12: qty 2

## 2019-07-12 MED ORDER — OXYCODONE HCL 5 MG PO TABS: 5.0000 mg | ORAL_TABLET | Freq: Once | ORAL | Status: DC | PRN

## 2019-07-12 MED ORDER — STERILE WATER FOR IRRIGATION IR SOLN
Status: DC | PRN
  Administered 2019-07-12: 1000 mL

## 2019-07-12 MED ORDER — MORPHINE SULFATE (PF) 4 MG/ML IV SOLN
4.0000 mg | INTRAVENOUS | Status: DC | PRN
  Administered 2019-07-12 (×4): 4 mg via INTRAVENOUS
  Filled 2019-07-12 (×3): qty 1

## 2019-07-12 MED ORDER — FENTANYL CITRATE (PF) 100 MCG/2ML IJ SOLN
INTRAMUSCULAR | Status: AC
  Filled 2019-07-12: qty 4

## 2019-07-12 MED ORDER — MIDAZOLAM HCL 2 MG/2ML IJ SOLN
INTRAMUSCULAR | Status: AC
  Filled 2019-07-12: qty 2

## 2019-07-12 MED ORDER — METHOCARBAMOL 1000 MG/10ML IJ SOLN
1000.0000 mg | Freq: Three times a day (TID) | INTRAVENOUS | Status: DC
  Administered 2019-07-12: 1000 mg via INTRAVENOUS
  Filled 2019-07-12 (×5): qty 10

## 2019-07-12 MED ORDER — ONDANSETRON HCL 4 MG/2ML IJ SOLN: 4.0000 mg | Freq: Four times a day (QID) | INTRAMUSCULAR | Status: DC | PRN

## 2019-07-12 MED ORDER — DEXAMETHASONE SODIUM PHOSPHATE 10 MG/ML IJ SOLN
INTRAMUSCULAR | Status: AC
  Filled 2019-07-12: qty 1

## 2019-07-12 MED ORDER — METOPROLOL TARTRATE 5 MG/5ML IV SOLN: 5.0000 mg | Freq: Four times a day (QID) | INTRAVENOUS | Status: DC | PRN

## 2019-07-12 MED ORDER — METHOCARBAMOL 500 MG PO TABS
1000.0000 mg | ORAL_TABLET | Freq: Three times a day (TID) | ORAL | Status: DC
  Administered 2019-07-12 – 2019-07-13 (×5): 1000 mg via ORAL
  Filled 2019-07-12 (×5): qty 2

## 2019-07-12 MED ORDER — ROCURONIUM BROMIDE 100 MG/10ML IV SOLN
INTRAVENOUS | Status: DC | PRN
  Administered 2019-07-12: 30 mg via INTRAVENOUS
  Administered 2019-07-12: 50 mg via INTRAVENOUS

## 2019-07-12 MED ORDER — CEFAZOLIN SODIUM 1 G IJ SOLR
INTRAMUSCULAR | Status: AC
  Filled 2019-07-12: qty 20

## 2019-07-12 MED ORDER — FENTANYL CITRATE (PF) 250 MCG/5ML IJ SOLN
INTRAMUSCULAR | Status: DC | PRN
  Administered 2019-07-12: 100 ug via INTRAVENOUS
  Administered 2019-07-12: 50 ug via INTRAVENOUS
  Administered 2019-07-12 (×2): 100 ug via INTRAVENOUS
  Administered 2019-07-12: 50 ug via INTRAVENOUS
  Administered 2019-07-12: 100 ug via INTRAVENOUS

## 2019-07-12 MED ORDER — ROCURONIUM BROMIDE 10 MG/ML (PF) SYRINGE
PREFILLED_SYRINGE | INTRAVENOUS | Status: AC
  Filled 2019-07-12: qty 10

## 2019-07-12 MED ORDER — ACETAMINOPHEN 500 MG PO TABS
1000.0000 mg | ORAL_TABLET | Freq: Four times a day (QID) | ORAL | Status: DC
  Administered 2019-07-12 – 2019-07-13 (×5): 1000 mg via ORAL
  Filled 2019-07-12 (×6): qty 2

## 2019-07-12 MED ORDER — MORPHINE SULFATE (PF) 4 MG/ML IV SOLN
INTRAVENOUS | Status: AC
  Filled 2019-07-12: qty 1

## 2019-07-12 MED ORDER — MIDAZOLAM HCL 5 MG/5ML IJ SOLN
INTRAMUSCULAR | Status: DC | PRN
  Administered 2019-07-12: 2 mg via INTRAVENOUS

## 2019-07-12 MED ORDER — PROPOFOL 10 MG/ML IV BOLUS
INTRAVENOUS | Status: DC | PRN
  Administered 2019-07-12: 180 ug via INTRAVENOUS
  Administered 2019-07-12: 40 ug via INTRAVENOUS

## 2019-07-12 MED ORDER — DOCUSATE SODIUM 100 MG PO CAPS
100.0000 mg | ORAL_CAPSULE | Freq: Two times a day (BID) | ORAL | Status: DC
  Administered 2019-07-12 – 2019-07-13 (×3): 100 mg via ORAL
  Filled 2019-07-12 (×3): qty 1

## 2019-07-12 MED ORDER — OXYCODONE HCL 5 MG/5ML PO SOLN: 5.0000 mg | Freq: Once | ORAL | Status: DC | PRN

## 2019-07-12 MED ORDER — TETANUS-DIPHTH-ACELL PERTUSSIS 5-2.5-18.5 LF-MCG/0.5 IM SUSP
0.5000 mL | Freq: Once | INTRAMUSCULAR | Status: AC
  Administered 2019-07-12: 0.5 mL via INTRAMUSCULAR
  Filled 2019-07-12: qty 0.5

## 2019-07-12 MED ORDER — LIDOCAINE 2% (20 MG/ML) 5 ML SYRINGE
INTRAMUSCULAR | Status: AC
  Filled 2019-07-12: qty 5

## 2019-07-12 SURGICAL SUPPLY — 42 items
BLADE CLIPPER SURG (BLADE) ×1 IMPLANT
BLADE SURG 10 STRL SS (BLADE) ×1 IMPLANT
COVER SURGICAL LIGHT HANDLE (MISCELLANEOUS) ×1 IMPLANT
DRAPE LAPAROSCOPIC ABDOMINAL (DRAPES) ×1 IMPLANT
DRSG OPSITE POSTOP 4X12 (GAUZE/BANDAGES/DRESSINGS) ×1 IMPLANT
ELECT BLADE 6.5 EXT (BLADE) ×1 IMPLANT
ELECT REM PT RETURN 9FT ADLT (ELECTROSURGICAL) ×2
ELECTRODE REM PT RTRN 9FT ADLT (ELECTROSURGICAL) IMPLANT
GAUZE SPONGE 4X4 12PLY STRL LF (GAUZE/BANDAGES/DRESSINGS) ×1 IMPLANT
GLOVE BIO SURGEON STRL SZ 6.5 (GLOVE) ×3 IMPLANT
GLOVE BIO SURGEON STRL SZ7.5 (GLOVE) ×1 IMPLANT
GLOVE BIOGEL PI IND STRL 6 (GLOVE) IMPLANT
GLOVE BIOGEL PI IND STRL 6.5 (GLOVE) IMPLANT
GLOVE BIOGEL PI INDICATOR 6 (GLOVE) ×1
GLOVE BIOGEL PI INDICATOR 6.5 (GLOVE) ×1
GOWN STRL REUS W/ TWL LRG LVL3 (GOWN DISPOSABLE) IMPLANT
GOWN STRL REUS W/TWL LRG LVL3 (GOWN DISPOSABLE) ×6
HANDLE SUCTION POOLE (INSTRUMENTS) IMPLANT
KIT BASIN OR (CUSTOM PROCEDURE TRAY) ×1 IMPLANT
MANIFOLD NEPTUNE II (INSTRUMENTS) ×1 IMPLANT
NS IRRIG 1000ML POUR BTL (IV SOLUTION) ×2 IMPLANT
PAD ARMBOARD 7.5X6 YLW CONV (MISCELLANEOUS) ×1 IMPLANT
PENCIL SMOKE EVACUATOR (MISCELLANEOUS) ×2 IMPLANT
SET IRRIG TUBING LAPAROSCOPIC (IRRIGATION / IRRIGATOR) ×1 IMPLANT
SET TUBE SMOKE EVAC HIGH FLOW (TUBING) ×1 IMPLANT
SLEEVE ENDOPATH XCEL 5M (ENDOMECHANICALS) ×1 IMPLANT
SPONGE LAP 18X18 RF (DISPOSABLE) ×4 IMPLANT
STAPLER VISISTAT 35W (STAPLE) ×1 IMPLANT
SUCTION POOLE HANDLE (INSTRUMENTS) ×2
SUT PDS AB 1 TP1 96 (SUTURE) ×2 IMPLANT
SUT SILK 2 0 SH CR/8 (SUTURE) ×1 IMPLANT
SUT SILK 2 0 TIES 10X30 (SUTURE) ×1 IMPLANT
SUT SILK 3 0 SH CR/8 (SUTURE) ×1 IMPLANT
SUT SILK 3 0 TIES 10X30 (SUTURE) ×1 IMPLANT
SUT VICRYL 0 UR6 27IN ABS (SUTURE) ×1 IMPLANT
TAPE CLOTH SURG 6X10 WHT LF (GAUZE/BANDAGES/DRESSINGS) ×1 IMPLANT
TOWEL GREEN STERILE (TOWEL DISPOSABLE) ×1 IMPLANT
TRAY LAPAROSCOPIC (CUSTOM PROCEDURE TRAY) ×1 IMPLANT
TROCAR XCEL BLADELESS 5X75MML (TROCAR) ×1 IMPLANT
TUBE CONNECTING 12X1/4 (SUCTIONS) ×1 IMPLANT
WATER STERILE IRR 1000ML POUR (IV SOLUTION) ×1 IMPLANT
YANKAUER SUCT BULB TIP NO VENT (SUCTIONS) ×1 IMPLANT
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
## 2019-07-12 NOTE — Progress Notes (Signed)
Arrived to 6n4 from PACU at this time. Right ABD gauze totally saturated with small amt bright red blood noted inside honeycomb.Will continue to monitor
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
## 2019-07-12 NOTE — Transfer of Care (Signed)
Immediate Anesthesia Transfer of Care Note  Patient: Dominic Baker  Procedure(s) Performed: LAPAROSCOPY DIAGNOSTIC (N/A Abdomen) Exploratory Laparotomy (N/A Abdomen)  Patient Location: PACU  Anesthesia Type:General  Level of Consciousness: drowsy  Airway & Oxygen Therapy: Patient Spontanous Breathing and Patient connected to face mask oxygen  Post-op Assessment: Report given to RN and Post -op Vital signs reviewed and stable  Post vital signs: Reviewed and stable  Last Vitals:  Vitals Value Taken Time  BP    Temp    Pulse 114 07/12/19 0333  Resp 22 07/12/19 0333  SpO2 98 % 07/12/19 0333    Last Pain:  Vitals:   07/12/19 0107  TempSrc:   PainSc: 9          Complications: No apparent anesthesia complications

## 2019-07-12 NOTE — Anesthesia Procedure Notes (Signed)
Procedure Name: Intubation Date/Time: 07/12/2019 1:51 AM Performed by: Claudina Lick, CRNA Pre-anesthesia Checklist: Patient identified, Emergency Drugs available, Suction available, Patient being monitored and Timeout performed Patient Re-evaluated:Patient Re-evaluated prior to induction Oxygen Delivery Method: Circle system utilized Preoxygenation: Pre-oxygenation with 100% oxygen Induction Type: IV induction, Rapid sequence and Cricoid Pressure applied Laryngoscope Size: Miller and 2 Grade View: Grade I Tube type: Oral Tube size: 7.5 mm Number of attempts: 1 Airway Equipment and Method: Stylet Placement Confirmation: ETT inserted through vocal cords under direct vision,  positive ETCO2 and breath sounds checked- equal and bilateral Secured at: 23 cm Tube secured with: Tape Dental Injury: Teeth and Oropharynx as per pre-operative assessment

## 2019-07-13 LAB — CBC
HCT: 47.2 % (ref 39.0–52.0)
Hemoglobin: 16.3 g/dL (ref 13.0–17.0)
MCH: 30 pg (ref 26.0–34.0)
MCHC: 34.5 g/dL (ref 30.0–36.0)
MCV: 86.8 fL (ref 80.0–100.0)
Platelets: 208 10*3/uL (ref 150–400)
RBC: 5.44 MIL/uL (ref 4.22–5.81)
RDW: 12.4 % (ref 11.5–15.5)
WBC: 7.5 10*3/uL (ref 4.0–10.5)
nRBC: 0 % (ref 0.0–0.2)

## 2019-07-13 LAB — LACTIC ACID, PLASMA: Lactic Acid, Venous: 1.2 mmol/L (ref 0.5–1.9)

## 2019-07-13 MED ORDER — OXYCODONE HCL 5 MG PO TABS: 5.0000 mg | ORAL_TABLET | Freq: Four times a day (QID) | ORAL | 0 refills | Status: DC | PRN

## 2019-07-13 MED ORDER — ACETAMINOPHEN 500 MG PO TABS: 1000.0000 mg | ORAL_TABLET | Freq: Three times a day (TID) | ORAL | 0 refills | Status: DC | PRN

## 2019-07-13 MED ORDER — POLYETHYLENE GLYCOL 3350 17 G PO PACK
17.0000 g | PACK | Freq: Every day | ORAL | Status: DC
  Administered 2019-07-13: 17 g via ORAL
  Filled 2019-07-13: qty 1

## 2019-07-13 MED ORDER — DOCUSATE SODIUM 100 MG PO CAPS: 100.0000 mg | ORAL_CAPSULE | Freq: Two times a day (BID) | ORAL | 0 refills | Status: DC | PRN

## 2019-07-13 MED ORDER — METHOCARBAMOL 500 MG PO TABS: 1000.0000 mg | ORAL_TABLET | Freq: Three times a day (TID) | ORAL | 0 refills | Status: DC | PRN
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
## 2019-07-13 NOTE — Discharge Instructions (Signed)
[facility]      [facility] [phone]  OPEN ABDOMINAL SURGERY: POST OP INSTRUCTIONS  Always review your discharge instruction sheet given to you by the facility where your surgery was performed.  IF YOU HAVE DISABILITY OR FAMILY LEAVE FORMS, YOU MUST BRING THEM TO THE OFFICE FOR PROCESSING.  PLEASE DO NOT GIVE THEM TO YOUR DOCTOR.  1. A prescription for pain medication may be given to you upon discharge.  Take your pain medication as prescribed, if needed.  If narcotic pain medicine is not needed, then you may take acetaminophen (Tylenol) or ibuprofen (Advil) as needed. 2. Take your usually prescribed medications unless otherwise directed. 3. If you need a refill on your pain medication, please contact your pharmacy. They will contact our office to request authorization.  Prescriptions will not be filled after 5pm or on week-ends. 4. You should follow a light diet the first few days after arrival home, such as soup and crackers, pudding, etc.unless your doctor has advised otherwise. A high-fiber, low fat diet can be resumed as tolerated.   Be sure to include lots of fluids daily. Most patients will experience some swelling and bruising on the chest and neck area.  Ice packs will help.  Swelling and bruising can take several days to resolve 5. Most patients will experience some swelling and bruising in the area of the incision. Ice pack will help. Swelling and bruising can take several days to resolve..  6. It is common to experience some constipation if taking pain medication after surgery.  Increasing fluid intake and taking a stool softener will usually help or prevent this problem from occurring.  A mild laxative (Milk of Magnesia or Miralax) should be taken according to package directions if there are no bowel movements after 48 hours. 7.  You may have steri-strips (small skin tapes) in place directly over the incision.  These strips should be left on the skin for 7-10 days.  If your  surgeon used skin glue on the incision, you may shower in 24 hours.  The glue will flake off over the next 2-3 weeks.  Any sutures or staples will be removed at the office during your follow-up visit. You may find that a light gauze bandage over your incision may keep your staples from being rubbed or pulled. You may shower and replace the bandage daily. 8. ACTIVITIES:  You may resume regular (light) daily activities beginning the next day--such as daily self-care, walking, climbing stairs--gradually increasing activities as tolerated.  You may have sexual intercourse when it is comfortable.  Refrain from any heavy lifting or straining until approved by your doctor. a. You may drive when you no longer are taking prescription pain medication, you can comfortably wear a seatbelt, and you can safely maneuver your car and apply brakes b. Return to Work: ___________________________________ 9. You should see your doctor in the office for a follow-up appointment approximately two weeks after your surgery.  Make sure that you call for this appointment within a day or two after you arrive home to insure a convenient appointment time. OTHER INSTRUCTIONS:  _____________________________________________________________ _____________________________________________________________  WHEN TO CALL YOUR DOCTOR: 1. Fever over 101.0 2. Inability to urinate 3. Nausea and/or vomiting 4. Extreme swelling or bruising 5. Continued bleeding from incision. 6. Increased pain, redness, or drainage from the incision. 7. Difficulty swallowing or breathing 8. Muscle cramping or spasms. 9. Numbness or tingling in hands or feet or around lips.  The clinic staff is available to   answer your questions during regular business hours.  Please don't hesitate to call and ask to speak to one of the nurses if you have concerns.  For further questions, please visit [email or web address]  .........   Managing Your Pain After  Surgery Without Opioids    Thank you for participating in our program to help patients manage their pain after surgery without opioids. This is part of our effort to provide you with the best care possible, without exposing you or your family to the risk that opioids pose.  What pain can I expect after surgery? You can expect to have some pain after surgery. This is normal. The pain is typically worse the day after surgery, and quickly begins to get better. Many studies have found that many patients are able to manage their pain after surgery with Over-the-Counter (OTC) medications such as Tylenol and Motrin. If you have a condition that does not allow you to take Tylenol or Motrin, notify your surgical team.  How will I manage my pain? The best strategy for controlling your pain after surgery is around the clock pain control with Tylenol (acetaminophen) and Motrin (ibuprofen or Advil). Alternating these medications with each other allows you to maximize your pain control. In addition to Tylenol and Motrin, you can use heating pads or ice packs on your incisions to help reduce your pain.  How will I alternate your regular strength over-the-counter pain medication? You will take a dose of pain medication every three hours. ; Start by taking 650 mg of Tylenol (2 pills of 325 mg) ; 3 hours later take 600 mg of Motrin (3 pills of 200 mg) ; 3 hours after taking the Motrin take 650 mg of Tylenol ; 3 hours after that take 600 mg of Motrin.   - 1 -  See example - if your first dose of Tylenol is at 12:00 PM   12:00 PM Tylenol 650 mg (2 pills of 325 mg)  3:00 PM Motrin 600 mg (3 pills of 200 mg)  6:00 PM Tylenol 650 mg (2 pills of 325 mg)  9:00 PM Motrin 600 mg (3 pills of 200 mg)  Continue alternating every 3 hours   We recommend that you follow this schedule around-the-clock for at least 3 days after surgery, or until you feel that it is no longer needed. Use the table on the last page of  this handout to keep track of the medications you are taking. Important: Do not take more than 3000mg of Tylenol or 3200mg of Motrin in a 24-hour period. Do not take ibuprofen/Motrin if you have a history of bleeding stomach ulcers, severe kidney disease, &/or actively taking a blood thinner  What if I still have pain? If you have pain that is not controlled with the over-the-counter pain medications (Tylenol and Motrin or Advil) you might have what we call "breakthrough" pain. You will receive a prescription for a small amount of an opioid pain medication such as Oxycodone, Tramadol, or Tylenol with Codeine. Use these opioid pills in the first 24 hours after surgery if you have breakthrough pain. Do not take more than 1 pill every 4-6 hours.  If you still have uncontrolled pain after using all opioid pills, don't hesitate to call our staff using the number provided. We will help make sure you are managing your pain in the best way possible, and if necessary, we can provide a prescription for additional pain medication.   Day 1    Time    Name of Medication Number of pills taken  Amount of Acetaminophen  Pain Level   Comments  AM PM       AM PM       AM PM       AM PM       AM PM       AM PM       AM PM       AM PM       Total Daily amount of Acetaminophen Do not take more than  3,000 mg per day      Day 2    Time  Name of Medication Number of pills taken  Amount of Acetaminophen  Pain Level   Comments  AM PM       AM PM       AM PM       AM PM       AM PM       AM PM       AM PM       AM PM       Total Daily amount of Acetaminophen Do not take more than  3,000 mg per day      Day 3    Time  Name of Medication Number of pills taken  Amount of Acetaminophen  Pain Level   Comments  AM PM       AM PM       AM PM       AM PM          AM PM       AM PM       AM PM       AM PM       Total Daily amount of Acetaminophen Do not take more than  3,000 mg per  day      Day 4    Time  Name of Medication Number of pills taken  Amount of Acetaminophen  Pain Level   Comments  AM PM       AM PM       AM PM       AM PM       AM PM       AM PM       AM PM       AM PM       Total Daily amount of Acetaminophen Do not take more than  3,000 mg per day      Day 5    Time  Name of Medication Number of pills taken  Amount of Acetaminophen  Pain Level   Comments  AM PM       AM PM       AM PM       AM PM       AM PM       AM PM       AM PM       AM PM       Total Daily amount of Acetaminophen Do not take more than  3,000 mg per day       Day 6    Time  Name of Medication Number of pills taken  Amount of Acetaminophen  Pain Level  Comments  AM PM       AM PM       AM PM       AM PM       AM   PM       AM PM       AM PM       AM PM       Total Daily amount of Acetaminophen Do not take more than  3,000 mg per day      Day 7    Time  Name of Medication Number of pills taken  Amount of Acetaminophen  Pain Level   Comments  AM PM       AM PM       AM PM       AM PM       AM PM       AM PM       AM PM       AM PM       Total Daily amount of Acetaminophen Do not take more than  3,000 mg per day        For additional information about how and where to safely dispose of unused opioid medications - https://www.morepowerfulnc.org  Disclaimer: This document contains information and/or instructional materials adapted from [facility] for the typical patient with your condition. It does not replace medical advice from your health care provider because your experience may differ from that of the typical patient. Talk to your health care provider if you have any questions about this document, your condition or your treatment plan. Adapted from [facility]   
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
## 2019-07-14 ENCOUNTER — Encounter (HOSPITAL_BASED_OUTPATIENT_CLINIC_OR_DEPARTMENT_OTHER): Payer: Self-pay

## 2019-07-29 ENCOUNTER — Telehealth (HOSPITAL_COMMUNITY): Payer: Self-pay

## 2019-07-29 NOTE — Telephone Encounter (Deleted)
Test note - please disregard

## 2019-08-04 ENCOUNTER — Inpatient Hospital Stay (INDEPENDENT_AMBULATORY_CARE_PROVIDER_SITE_OTHER): Payer: Medicaid Other | Admitting: Primary Care

## 2019-08-05 ENCOUNTER — Telehealth (INDEPENDENT_AMBULATORY_CARE_PROVIDER_SITE_OTHER): Payer: Self-pay | Admitting: Primary Care

## 2019-08-05 DIAGNOSIS — Z7689 Persons encountering health services in other specified circumstances: Secondary | ICD-10-CM

## 2019-08-05 DIAGNOSIS — Z09 Encounter for follow-up examination after completed treatment for conditions other than malignant neoplasm: Secondary | ICD-10-CM

## 2019-08-05 DIAGNOSIS — F310 Bipolar disorder, current episode hypomanic: Secondary | ICD-10-CM

## 2019-08-14 ENCOUNTER — Encounter (HOSPITAL_COMMUNITY): Payer: Self-pay | Admitting: Psychiatry

## 2019-08-14 ENCOUNTER — Other Ambulatory Visit: Payer: Self-pay

## 2019-08-14 ENCOUNTER — Observation Stay (HOSPITAL_COMMUNITY): Admit: 2019-08-14 | Payer: Federal, State, Local not specified - Other | Admitting: Psychiatry

## 2019-08-14 ENCOUNTER — Observation Stay (HOSPITAL_COMMUNITY)
Admission: AD | Admit: 2019-08-14 | Discharge: 2019-08-16 | Disposition: A | Discharge status: Home / Self Care | Payer: Federal, State, Local not specified - Other | Attending: Psychiatry | Admitting: Psychiatry

## 2019-08-14 DIAGNOSIS — R45851 Suicidal ideations: Secondary | ICD-10-CM | POA: Insufficient documentation

## 2019-08-14 DIAGNOSIS — F419 Anxiety disorder, unspecified: Secondary | ICD-10-CM | POA: Insufficient documentation

## 2019-08-14 DIAGNOSIS — F319 Bipolar disorder, unspecified: Principal | ICD-10-CM | POA: Diagnosis present

## 2019-08-14 DIAGNOSIS — I10 Essential (primary) hypertension: Secondary | ICD-10-CM | POA: Insufficient documentation

## 2019-08-14 DIAGNOSIS — Z818 Family history of other mental and behavioral disorders: Secondary | ICD-10-CM | POA: Insufficient documentation

## 2019-08-14 DIAGNOSIS — F4310 Post-traumatic stress disorder, unspecified: Secondary | ICD-10-CM | POA: Insufficient documentation

## 2019-08-14 DIAGNOSIS — F418 Other specified anxiety disorders: Secondary | ICD-10-CM | POA: Insufficient documentation

## 2019-08-14 DIAGNOSIS — F17210 Nicotine dependence, cigarettes, uncomplicated: Secondary | ICD-10-CM | POA: Insufficient documentation

## 2019-08-14 DIAGNOSIS — Z79899 Other long term (current) drug therapy: Secondary | ICD-10-CM | POA: Insufficient documentation

## 2019-08-14 DIAGNOSIS — F908 Attention-deficit hyperactivity disorder, other type: Secondary | ICD-10-CM | POA: Insufficient documentation

## 2019-08-14 DIAGNOSIS — Z20822 Contact with and (suspected) exposure to covid-19: Secondary | ICD-10-CM | POA: Insufficient documentation

## 2019-08-14 LAB — COMPREHENSIVE METABOLIC PANEL
ALT: 57 U/L — ABNORMAL HIGH (ref 0–44)
AST: 29 U/L (ref 15–41)
Albumin: 4.7 g/dL (ref 3.5–5.0)
Alkaline Phosphatase: 45 U/L (ref 38–126)
Anion gap: 10 (ref 5–15)
BUN: 18 mg/dL (ref 6–20)
CO2: 24 mmol/L (ref 22–32)
Calcium: 9.6 mg/dL (ref 8.9–10.3)
Chloride: 106 mmol/L (ref 98–111)
Creatinine, Ser: 1.07 mg/dL (ref 0.61–1.24)
GFR calc Af Amer: >60 mL/min (ref >60)
GFR calc non Af Amer: >60 mL/min (ref >60)
Glucose, Bld: 111 mg/dL — ABNORMAL HIGH (ref 70–99)
Potassium: 4 mmol/L (ref 3.5–5.1)
Sodium: 140 mmol/L (ref 135–145)
Total Bilirubin: 1 mg/dL (ref 0.3–1.2)
Total Protein: 7.5 g/dL (ref 6.5–8.1)

## 2019-08-14 LAB — RESPIRATORY PANEL BY RT PCR (FLU A&B, COVID)
Influenza A by PCR: NEGATIVE
Influenza B by PCR: NEGATIVE
SARS Coronavirus 2 by RT PCR: NEGATIVE

## 2019-08-14 LAB — CBC WITH DIFFERENTIAL/PLATELET
Abs Immature Granulocytes: 0.02 10*3/uL (ref 0.00–0.07)
Basophils Absolute: 0.1 10*3/uL (ref 0.0–0.1)
Basophils Relative: 1 %
Eosinophils Absolute: 0.3 10*3/uL (ref 0.0–0.5)
Eosinophils Relative: 4 %
HCT: 48.5 % (ref 39.0–52.0)
Hemoglobin: 16.6 g/dL (ref 13.0–17.0)
Immature Granulocytes: 0 %
Lymphocytes Relative: 21 %
Lymphs Abs: 1.4 10*3/uL (ref 0.7–4.0)
MCH: 29.9 pg (ref 26.0–34.0)
MCHC: 34.2 g/dL (ref 30.0–36.0)
MCV: 87.4 fL (ref 80.0–100.0)
Monocytes Absolute: 0.4 10*3/uL (ref 0.1–1.0)
Monocytes Relative: 7 %
Neutro Abs: 4.3 10*3/uL (ref 1.7–7.7)
Neutrophils Relative %: 67 %
Platelets: 205 10*3/uL (ref 150–400)
RBC: 5.55 MIL/uL (ref 4.22–5.81)
RDW: 11.9 % (ref 11.5–15.5)
WBC: 6.5 10*3/uL (ref 4.0–10.5)
nRBC: 0 % (ref 0.0–0.2)

## 2019-08-14 LAB — TSH: TSH: 2.424 u[IU]/mL (ref 0.350–4.500)

## 2019-08-14 MED ORDER — MAGNESIUM HYDROXIDE 400 MG/5ML PO SUSP: 30.0000 mL | Freq: Every day | ORAL | Status: DC | PRN

## 2019-08-14 MED ORDER — ALUM & MAG HYDROXIDE-SIMETH 200-200-20 MG/5ML PO SUSP: 30.0000 mL | ORAL | Status: DC | PRN

## 2019-08-14 MED ORDER — ACETAMINOPHEN 325 MG PO TABS: 650.0000 mg | ORAL_TABLET | Freq: Four times a day (QID) | ORAL | Status: DC | PRN

## 2019-08-14 MED ORDER — ARIPIPRAZOLE 5 MG PO TABS
5.0000 mg | ORAL_TABLET | Freq: Every day | ORAL | Status: DC
  Administered 2019-08-15: 09:00:00 5 mg via ORAL
  Filled 2019-08-14 (×5): qty 1

## 2019-08-14 MED ORDER — TRAZODONE HCL 50 MG PO TABS
50.0000 mg | ORAL_TABLET | Freq: Every day | ORAL | Status: DC
  Administered 2019-08-14 – 2019-08-15 (×2): 50 mg via ORAL
  Filled 2019-08-14 (×4): qty 1

## 2019-08-14 MED ORDER — PRAZOSIN HCL 1 MG PO CAPS
1.0000 mg | ORAL_CAPSULE | Freq: Every day | ORAL | Status: DC
  Administered 2019-08-14: 1 mg via ORAL
  Filled 2019-08-14 (×3): qty 1
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
## 2019-08-14 NOTE — H&P (Addendum)
Behavioral Health Medical Screening Exam  Dominic Baker is an 24 y.o. male.  Total Time spent with patient: 30 minutes  Psychiatric Specialty Exam: Physical Exam  Nursing note and vitals reviewed. Constitutional: He is oriented to person, place, and time. He appears well-developed and well-nourished.  Cardiovascular: Normal rate.  Respiratory: Effort normal.  Musculoskeletal:        General: Normal range of motion.  Neurological: He is alert and oriented to person, place, and time.  Skin: Skin is warm.       Review of Systems  Constitutional: Negative.   HENT: Negative.   Eyes: Negative.   Respiratory: Negative.   Cardiovascular: Negative.   Gastrointestinal: Negative.   Genitourinary: Negative.   Musculoskeletal: Negative.   Skin: Negative.   Neurological: Negative.   Psychiatric/Behavioral: Positive for agitation, behavioral problems, self-injury and suicidal ideas.    There were no vitals taken for this visit.There is no height or weight on file to calculate BMI.  General Appearance: Casual  Eye Contact:  Good  Speech:  Clear and Coherent and Normal Rate  Volume:  Normal  Mood:  Anxious and Demanding  Affect:  Congruent  Thought Process:  Coherent and Descriptions of Associations: Intact  Orientation:  Full (Time, Place, and Person)  Thought Content:  WDL  Suicidal Thoughts:  Yes.  with intent/plan  Homicidal Thoughts:  No  Memory:  Immediate;   Fair Recent;   Fair Remote;   Fair  Judgement:  Fair  Insight:  Good  Psychomotor Activity:  Normal  Concentration: Concentration: Good  Recall:  Good  Fund of Knowledge:Good  Language: Good  Akathisia:  No  Handed:  Right  AIMS (if indicated):     Assets:  Communication Skills Desire for Improvement Housing Social Support Transportation  Sleep:       Musculoskeletal: Strength & Muscle Tone: within normal limits Gait & Station: normal Patient leans: N/A  There were no vitals taken for this visit.   Recommendations:  Based on my evaluation the patient does not appear to have an emergency medical condition.  Gerlene Burdock Adelise Buswell, FNP 08/14/2019, 2:15 PM
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
## 2019-08-15 DIAGNOSIS — F3163 Bipolar disorder, current episode mixed, severe, without psychotic features: Secondary | ICD-10-CM | POA: Diagnosis not present

## 2019-08-15 LAB — RAPID URINE DRUG SCREEN, HOSP PERFORMED
Amphetamines: NOT DETECTED
Barbiturates: NOT DETECTED
Benzodiazepines: NOT DETECTED
Cocaine: NOT DETECTED
Opiates: NOT DETECTED
Tetrahydrocannabinol: NOT DETECTED

## 2019-08-15 MED ORDER — PRAZOSIN HCL 1 MG PO CAPS
4.0000 mg | ORAL_CAPSULE | Freq: Every day | ORAL | Status: DC
  Administered 2019-08-15: 21:00:00 4 mg via ORAL
  Filled 2019-08-15: qty 4

## 2019-08-15 MED ORDER — ARIPIPRAZOLE 15 MG PO TABS: 15.0000 mg | ORAL_TABLET | Freq: Every day | ORAL | Status: DC

## 2019-08-15 MED ORDER — TEMAZEPAM 15 MG PO CAPS
30.0000 mg | ORAL_CAPSULE | Freq: Every day | ORAL | Status: DC
  Administered 2019-08-15: 21:00:00 30 mg via ORAL
  Filled 2019-08-15: qty 2

## 2019-08-15 MED ORDER — FLUOXETINE HCL 20 MG PO CAPS
20.0000 mg | ORAL_CAPSULE | Freq: Every day | ORAL | Status: DC
  Administered 2019-08-15: 20 mg via ORAL
  Filled 2019-08-15 (×2): qty 1

## 2019-08-15 MED ORDER — GABAPENTIN 300 MG PO CAPS
300.0000 mg | ORAL_CAPSULE | Freq: Three times a day (TID) | ORAL | Status: DC
  Administered 2019-08-15: 12:00:00 300 mg via ORAL
  Filled 2019-08-15 (×2): qty 1

## 2019-08-15 NOTE — Progress Notes (Signed)
[place] NOVEL CORONAVIRUS (COVID-19) DAILY CHECK-OFF SYMPTOMS - answer yes or no to each - every day NO YES  Have you had a fever in the past 24 hours?  . Fever (Temp > 37.80C / 100F) X   Have you had any of these symptoms in the past 24 hours? . New Cough .  Sore Throat  .  Shortness of Breath .  Difficulty Breathing .  Unexplained Body Aches   X   Have you had any one of these symptoms in the past 24 hours not related to allergies?   . Runny Nose .  Nasal Congestion .  Sneezing   X   If you have had runny nose, nasal congestion, sneezing in the past 24 hours, has it worsened?  X   EXPOSURES - check yes or no X   Have you traveled outside the state in the past 14 days?  X   Have you been in contact with someone with a confirmed diagnosis of COVID-19 or PUI in the past 14 days without wearing appropriate PPE?  X   Have you been living in the same home as a person with confirmed diagnosis of COVID-19 or a PUI (household contact)?    X   Have you been diagnosed with COVID-19?    X              What to do next: Answered NO to all: Answered YES to anything:   Proceed with unit schedule Follow the [facility] Inpatient Flowsheet.   Darshana Curnutt K. Karem Tomaso MSN, RN, [facility] [phone] 
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
## 2019-08-16 DIAGNOSIS — F3163 Bipolar disorder, current episode mixed, severe, without psychotic features: Secondary | ICD-10-CM | POA: Diagnosis not present

## 2019-08-16 MED ORDER — GABAPENTIN 300 MG PO CAPS: 300.0000 mg | ORAL_CAPSULE | Freq: Three times a day (TID) | ORAL | 2 refills | Status: AC

## 2019-08-16 MED ORDER — TRAZODONE HCL 150 MG PO TABS: 150.0000 mg | ORAL_TABLET | Freq: Every day | ORAL | 2 refills | Status: AC

## 2019-08-16 MED ORDER — ARIPIPRAZOLE 15 MG PO TABS: 15.0000 mg | ORAL_TABLET | Freq: Every day | ORAL | 2 refills | Status: AC

## 2019-08-16 MED ORDER — PRAZOSIN HCL 2 MG PO CAPS: 4.0000 mg | ORAL_CAPSULE | Freq: Every day | ORAL | 2 refills | Status: AC

## 2019-08-16 MED ORDER — FLUOXETINE HCL 20 MG PO CAPS: 20.0000 mg | ORAL_CAPSULE | Freq: Every day | ORAL | 3 refills | Status: AC
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
## 2019-08-16 NOTE — Progress Notes (Signed)
   08/15/19 2042  Psych Admission Type (Psych Patients Only)  Admission Status Involuntary  Psychosocial Assessment  Patient Complaints None  Eye Contact Fair  Facial Expression Sullen;Sad  Affect Sullen  Speech Logical/coherent  Interaction Guarded  Appearance/Hygiene Unremarkable  Behavior Characteristics Cooperative  Mood Pleasant  Thought Process  Coherency Circumstantial  Content WDL  Delusions WDL  Perception WDL  Hallucination None reported or observed  Judgment Poor  Confusion WDL  Danger to Self  Current suicidal ideation? Verbalizes  Self-Injurious Behavior No self-injurious ideation or behavior indicators observed or expressed   Agreement Not to Harm Self Yes  Description of Agreement verbal  Danger to Others  Danger to Others None reported or observed
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
## 2019-08-16 NOTE — Progress Notes (Signed)
[place] NOVEL CORONAVIRUS (COVID-19) DAILY CHECK-OFF SYMPTOMS - answer yes or no to each - every day NO YES  Have you had a fever in the past 24 hours?  . Fever (Temp > 37.80C / 100F) X   Have you had any of these symptoms in the past 24 hours? . New Cough .  Sore Throat  .  Shortness of Breath .  Difficulty Breathing .  Unexplained Body Aches   X   Have you had any one of these symptoms in the past 24 hours not related to allergies?   . Runny Nose .  Nasal Congestion .  Sneezing   X   If you have had runny nose, nasal congestion, sneezing in the past 24 hours, has it worsened?  X   EXPOSURES - check yes or no X   Have you traveled outside the state in the past 14 days?  X   Have you been in contact with someone with a confirmed diagnosis of COVID-19 or PUI in the past 14 days without wearing appropriate PPE?  X   Have you been living in the same home as a person with confirmed diagnosis of COVID-19 or a PUI (household contact)?    X   Have you been diagnosed with COVID-19?    X              What to do next: Answered NO to all: Answered YES to anything:   Proceed with unit schedule Follow the [facility] Inpatient Flowsheet.   Lisset Ketchem K. Alaze Garverick MSN, RN, [facility] [phone] 

## 2019-08-16 NOTE — Progress Notes (Signed)
  Discharge:  Pt is a caucasian male  of  24 y.o. ,DOB [birth year] , MRN  [id number]   Pt is alert and oriented X 4. Pt given AVS packet to include medication information, future appointment information, and other resource information. Pt given all belongings and signature obtained. Pt discharged to private resident with family member. Transportation provided my family member.    Einar Crow. Melvyn Neth MSN, RN, [facility] [phone]

## 2019-09-05 ENCOUNTER — Ambulatory Visit (INDEPENDENT_AMBULATORY_CARE_PROVIDER_SITE_OTHER): Payer: Self-pay | Admitting: Primary Care

## 2019-09-05 ENCOUNTER — Other Ambulatory Visit: Payer: Self-pay

## 2019-09-05 DIAGNOSIS — F310 Bipolar disorder, current episode hypomanic: Secondary | ICD-10-CM

## 2019-09-05 DIAGNOSIS — Z0130 Encounter for examination of blood pressure without abnormal findings: Secondary | ICD-10-CM

## 2019-09-05 NOTE — Progress Notes (Signed)
Virtual Visit via Telephone Note  I connected with [name] on 09/05/19 at  4:10 PM EST by telephone and verified that I am speaking with the correct person using two identifiers.   I discussed the limitations, risks, security and privacy concerns of performing an evaluation and management service by telephone and the availability of in person appointments. I also discussed with the patient that there may be a patient responsible charge related to this service. The patient expressed understanding and agreed to proceed.   History of Present Illness: Dominic Baker is having a tele visit for blood pressure follow up today Bp was 132/80. Discussed diagnosis for hypertension and review and discussing Bp reviews he would be need to be prescribed Bp medication. Patient states all his other medication he does not want to take Bp medication.   Past Medical History:  Diagnosis Date  . ADHD, impulsive type    intermittent explosive disorder  . Anxiety   . Bipolar disorder (HCC)   . Depression    Current Outpatient Medications on File Prior to Visit  Medication Sig Dispense Refill  . ARIPiprazole (ABILIFY) 15 MG tablet Take 1 tablet (15 mg total) by mouth daily. 30 tablet 2  . prazosin (MINIPRESS) 2 MG capsule Take 2 capsules (4 mg total) by mouth at bedtime. 30 capsule 2  . traZODone (DESYREL) 150 MG tablet Take 1 tablet (150 mg total) by mouth at bedtime. 30 tablet 2  . FLUoxetine (PROZAC) 20 MG capsule Take 1 capsule (20 mg total) by mouth daily. (Patient not taking: Reported on 09/05/2019) 30 capsule 3  . gabapentin (NEURONTIN) 300 MG capsule Take 1 capsule (300 mg total) by mouth 3 (three) times daily. (Patient not taking: Reported on 09/05/2019) 90 capsule 2   No current facility-administered medications on file prior to visit.   Observations/Objective: Review of Systems  Psychiatric/Behavioral: Positive for depression, hallucinations and suicidal ideas. The patient is nervous/anxious.    All other systems reviewed and are negative.   Assessment and Plan: Desmon was seen today for blood pressure check.  Diagnoses and all orders for this visit:  Blood pressure check  Bp is greater or equal to 130/80 is diagnosis for hypertension. Patient adamantly stated he would not take medication he is on enough. We dicussed ways to monitor and managed Bp fast food restaurants, snacks potato chips, cheezit , high intake or salt recommended 150 minutes of moderate intensity exercise per week. Nutrition counseling >  Bipolar affective disorder, current episode hypomanic [facility]) Managed outside of practice psychiatry Dr. Burlene Arnt .    Follow Up Instructions:    I discussed the assessment and treatment plan with the patient. The patient was provided an opportunity to ask questions and all were answered. The patient agreed with the plan and demonstrated an understanding of the instructions.   The patient was advised to call back or seek an in-person evaluation if the symptoms worsen or if the condition fails to improve as anticipated.  I provided 15 minutes of non-face-to-face time during this encounter.   Grayce Sessions, NP

## 2019-09-05 NOTE — Progress Notes (Signed)
Bp today 132/80 after medications

## 2019-10-18 IMAGING — CR DG CHEST 2V
2 series · 2 of 2 positions shown · non-contrast
Comparison: None.

CLINICAL DATA: Chest pain

EXAM:
CHEST - 2 VIEW

[chest pa]
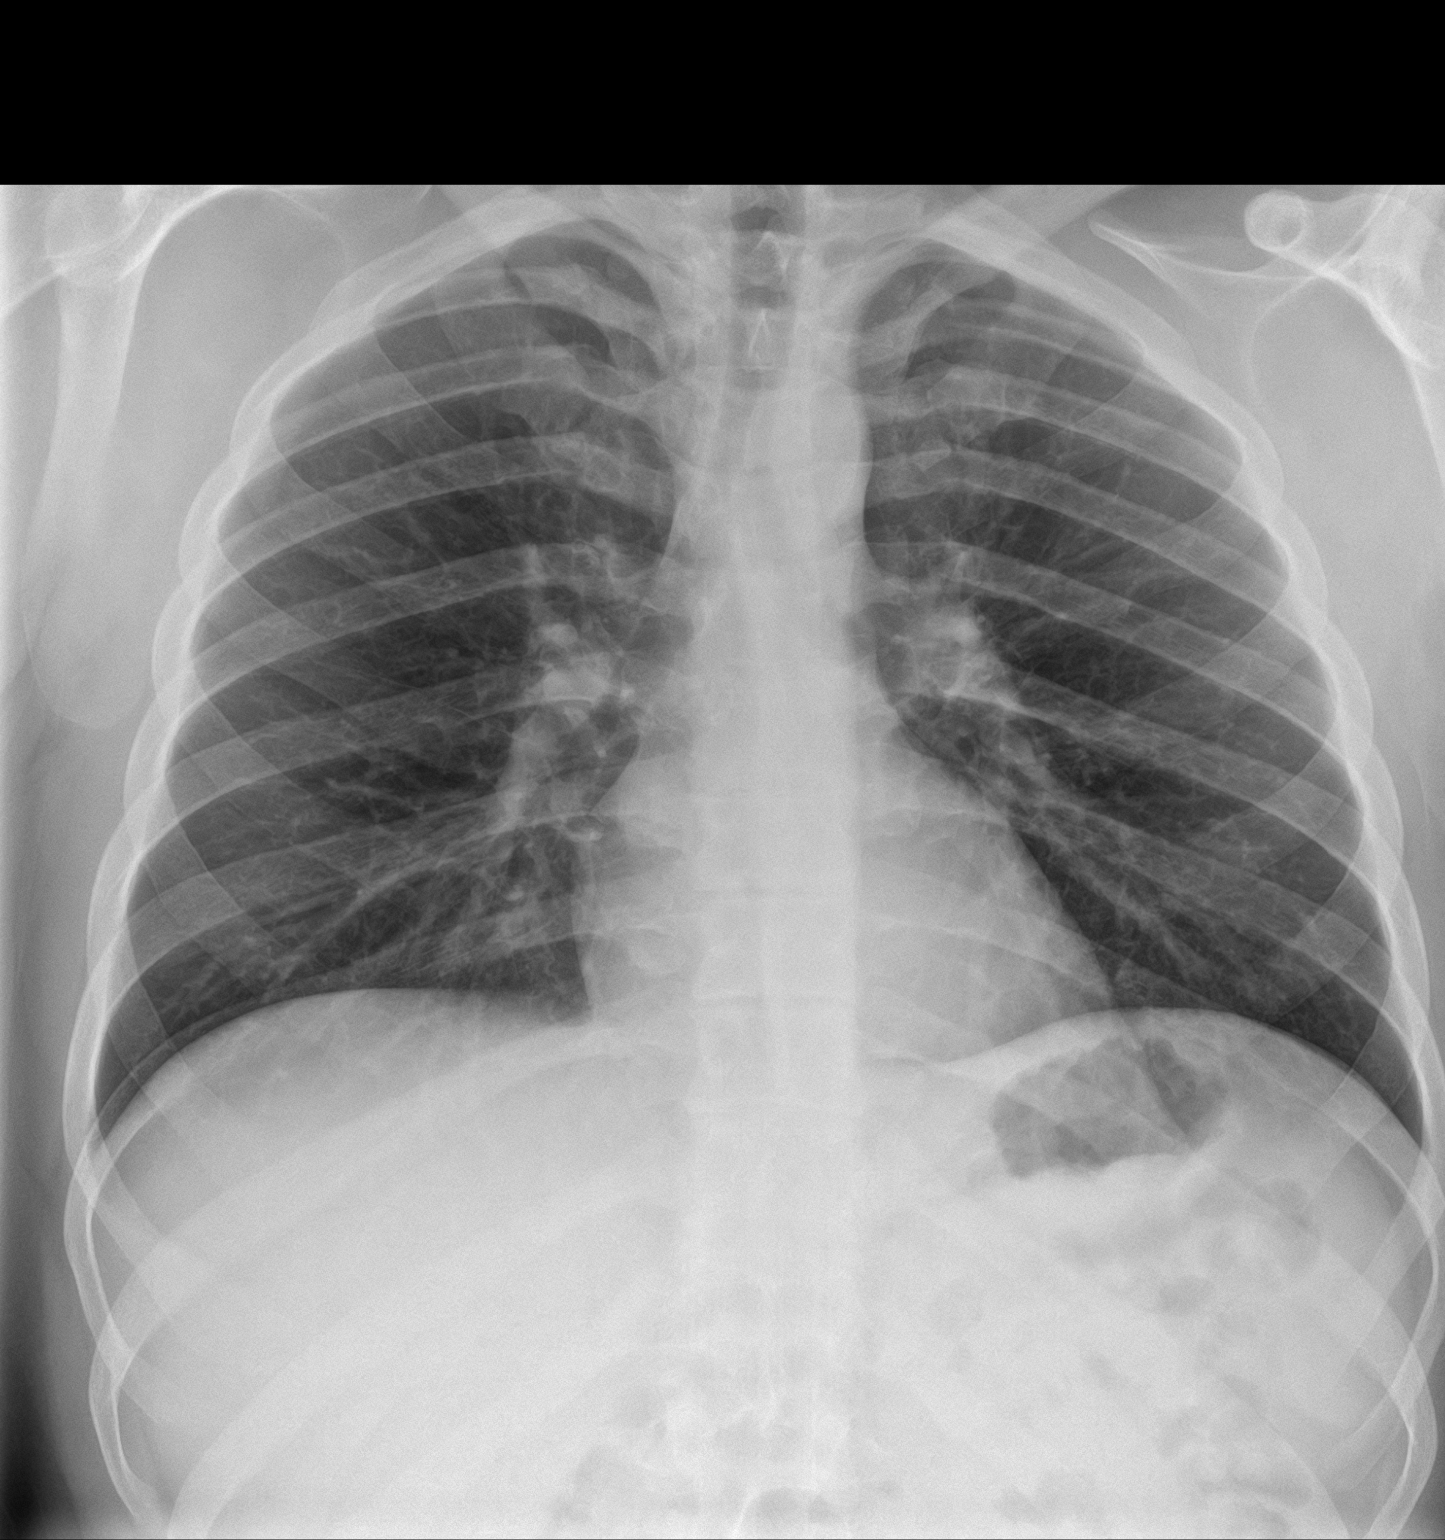

[chest lat]
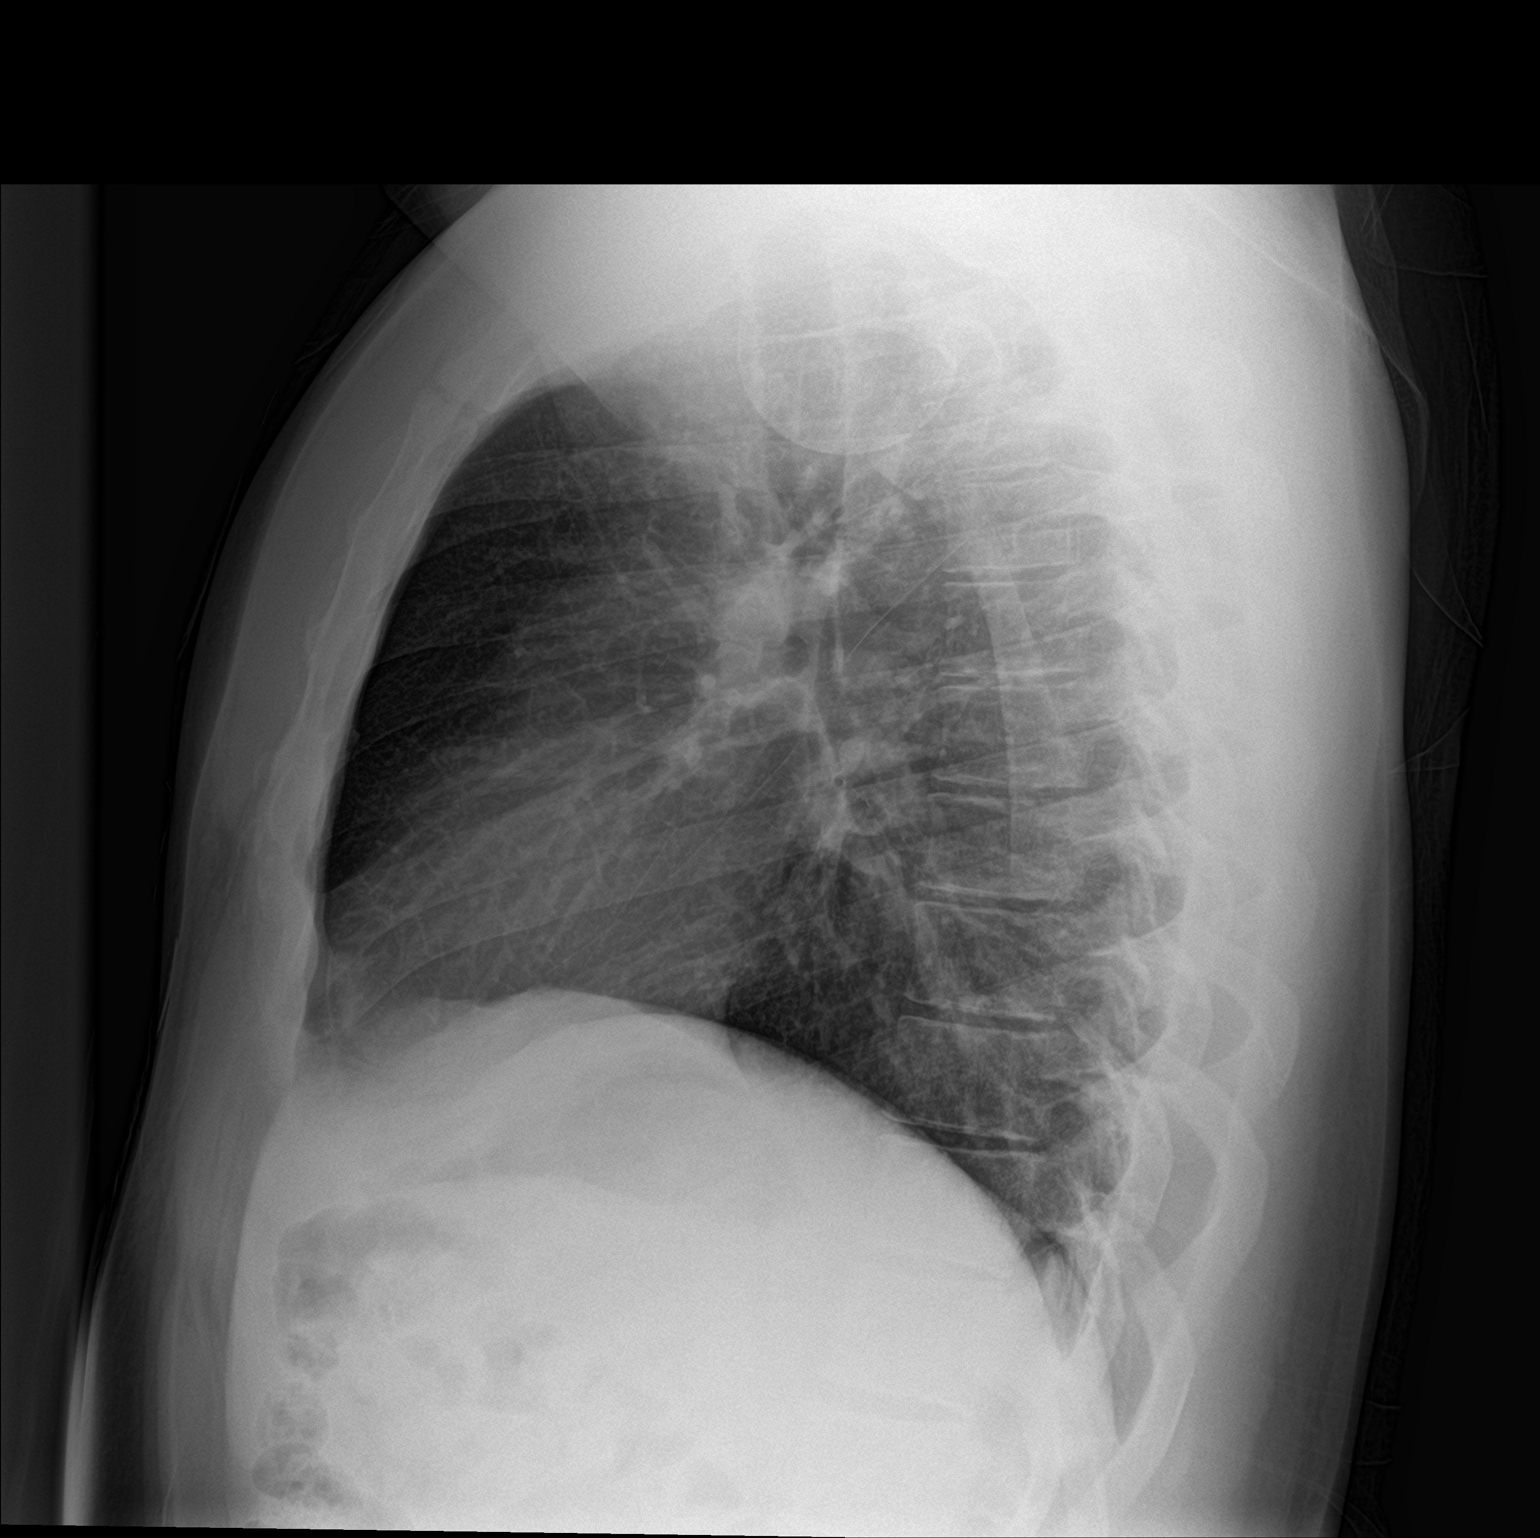

[2 of 2 positions shown; findings below may reference images not displayed]

FINDINGS: The heart size and mediastinal contours are within normal limits.
Both lungs are clear. The visualized skeletal structures are
unremarkable.
IMPRESSION: No active cardiopulmonary disease.
